# Patient Record
Sex: Female | Born: 1980
Health system: Southern US, Community
[De-identification: ages and names within clinical notes are randomized; demographics above are authoritative.]

## PROBLEM LIST (undated history)

## (undated) DIAGNOSIS — E7800 Pure hypercholesterolemia, unspecified: Secondary | ICD-10-CM

## (undated) DIAGNOSIS — N62 Hypertrophy of breast: Secondary | ICD-10-CM

## (undated) DIAGNOSIS — K219 Gastro-esophageal reflux disease without esophagitis: Secondary | ICD-10-CM

## (undated) DIAGNOSIS — T7840XA Allergy, unspecified, initial encounter: Secondary | ICD-10-CM

## (undated) DIAGNOSIS — M1990 Unspecified osteoarthritis, unspecified site: Secondary | ICD-10-CM

## (undated) DIAGNOSIS — E669 Obesity, unspecified: Secondary | ICD-10-CM

## (undated) HISTORY — PX: BREAST SURGERY: SHX581

## (undated) HISTORY — PX: WISDOM TOOTH EXTRACTION: SHX21

## (undated) HISTORY — PX: REDUCTION MAMMAPLASTY: SUR839

## (undated) HISTORY — DX: Obesity, unspecified: E66.9

## (undated) HISTORY — DX: Unspecified osteoarthritis, unspecified site: M19.90

## (undated) HISTORY — PX: CHOLECYSTECTOMY: SHX55

## (undated) HISTORY — DX: Allergy, unspecified, initial encounter: T78.40XA

## (undated) HISTORY — DX: Hypertrophy of breast: N62

---

## 2002-02-18 ENCOUNTER — Emergency Department (HOSPITAL_COMMUNITY): Admission: EM | Admit: 2002-02-18 | Discharge: 2002-02-18 | Payer: Self-pay | Admitting: Emergency Medicine

## 2004-09-22 ENCOUNTER — Ambulatory Visit: Payer: Self-pay | Admitting: Family Medicine

## 2004-09-22 LAB — CONVERTED CEMR LAB: Pap Smear: NORMAL

## 2005-01-16 ENCOUNTER — Emergency Department (HOSPITAL_COMMUNITY): Admission: EM | Admit: 2005-01-16 | Discharge: 2005-01-16 | Payer: Self-pay | Admitting: Emergency Medicine

## 2005-01-23 ENCOUNTER — Emergency Department (HOSPITAL_COMMUNITY): Admission: EM | Admit: 2005-01-23 | Discharge: 2005-01-23 | Payer: Self-pay | Admitting: Emergency Medicine

## 2005-01-26 ENCOUNTER — Ambulatory Visit: Payer: Self-pay | Admitting: Family Medicine

## 2005-03-10 ENCOUNTER — Ambulatory Visit: Payer: Self-pay | Admitting: Family Medicine

## 2005-05-15 ENCOUNTER — Emergency Department (HOSPITAL_COMMUNITY): Admission: EM | Admit: 2005-05-15 | Discharge: 2005-05-16 | Payer: Self-pay | Admitting: *Deleted

## 2005-08-15 ENCOUNTER — Ambulatory Visit: Payer: Self-pay | Admitting: Family Medicine

## 2005-10-14 ENCOUNTER — Encounter (INDEPENDENT_AMBULATORY_CARE_PROVIDER_SITE_OTHER): Payer: Self-pay | Admitting: Specialist

## 2005-10-14 ENCOUNTER — Ambulatory Visit: Payer: Self-pay | Admitting: Family Medicine

## 2005-10-14 ENCOUNTER — Other Ambulatory Visit: Admission: RE | Admit: 2005-10-14 | Discharge: 2005-10-14 | Payer: Self-pay | Admitting: Family Medicine

## 2005-10-14 LAB — CONVERTED CEMR LAB: Pap Smear: NORMAL

## 2005-10-17 ENCOUNTER — Ambulatory Visit (HOSPITAL_COMMUNITY): Admission: RE | Admit: 2005-10-17 | Discharge: 2005-10-17 | Payer: Self-pay | Admitting: Family Medicine

## 2005-12-14 ENCOUNTER — Emergency Department (HOSPITAL_COMMUNITY): Admission: EM | Admit: 2005-12-14 | Discharge: 2005-12-14 | Payer: Self-pay | Admitting: Emergency Medicine

## 2005-12-19 ENCOUNTER — Emergency Department (HOSPITAL_COMMUNITY): Admission: EM | Admit: 2005-12-19 | Discharge: 2005-12-19 | Payer: Self-pay | Admitting: Emergency Medicine

## 2006-01-25 ENCOUNTER — Ambulatory Visit: Payer: Self-pay | Admitting: Family Medicine

## 2006-01-25 ENCOUNTER — Ambulatory Visit (HOSPITAL_COMMUNITY): Admission: RE | Admit: 2006-01-25 | Discharge: 2006-01-25 | Payer: Self-pay | Admitting: Obstetrics & Gynecology

## 2006-01-25 ENCOUNTER — Encounter (INDEPENDENT_AMBULATORY_CARE_PROVIDER_SITE_OTHER): Payer: Self-pay | Admitting: *Deleted

## 2006-11-24 ENCOUNTER — Encounter: Payer: Self-pay | Admitting: Family Medicine

## 2006-11-24 LAB — CONVERTED CEMR LAB: hCG, Beta Chain, Quant, S: <2 milliintl units/mL

## 2007-02-04 ENCOUNTER — Emergency Department (HOSPITAL_COMMUNITY): Admission: EM | Admit: 2007-02-04 | Discharge: 2007-02-04 | Payer: Self-pay | Admitting: Emergency Medicine

## 2007-04-13 ENCOUNTER — Ambulatory Visit: Payer: Self-pay | Admitting: Family Medicine

## 2007-04-25 ENCOUNTER — Emergency Department (HOSPITAL_COMMUNITY): Admission: EM | Admit: 2007-04-25 | Discharge: 2007-04-25 | Payer: Self-pay | Admitting: Emergency Medicine

## 2007-07-24 ENCOUNTER — Encounter: Payer: Self-pay | Admitting: Family Medicine

## 2007-07-24 DIAGNOSIS — N62 Hypertrophy of breast: Secondary | ICD-10-CM | POA: Insufficient documentation

## 2007-08-12 ENCOUNTER — Emergency Department (HOSPITAL_COMMUNITY): Admission: EM | Admit: 2007-08-12 | Discharge: 2007-08-12 | Payer: Self-pay | Admitting: Emergency Medicine

## 2008-01-02 ENCOUNTER — Ambulatory Visit: Payer: Self-pay | Admitting: Family Medicine

## 2008-01-26 ENCOUNTER — Emergency Department (HOSPITAL_COMMUNITY): Admission: EM | Admit: 2008-01-26 | Discharge: 2008-01-26 | Payer: Self-pay | Admitting: Emergency Medicine

## 2008-03-26 ENCOUNTER — Encounter: Payer: Self-pay | Admitting: Family Medicine

## 2008-03-26 LAB — CONVERTED CEMR LAB
BUN: 9 mg/dL (ref 6–23)
Basophils Absolute: 0 10*3/uL (ref 0.0–0.1)
Basophils Relative: 0 % (ref 0–1)
CO2: 23 meq/L (ref 19–32)
Calcium: 9 mg/dL (ref 8.4–10.5)
Chloride: 105 meq/L (ref 96–112)
Cholesterol: 168 mg/dL (ref 0–200)
Creatinine, Ser: 0.75 mg/dL (ref 0.40–1.20)
Eosinophils Absolute: 0.1 10*3/uL (ref 0.0–0.7)
Eosinophils Relative: 2 % (ref 0–5)
Glucose, Bld: 80 mg/dL (ref 70–99)
HCT: 39.9 % (ref 36.0–46.0)
HDL: 43 mg/dL (ref >39)
Helicobacter Pylori Antibody-IgG: <0.4
Hemoglobin: 12.7 g/dL (ref 12.0–15.0)
LDL Cholesterol: 101 mg/dL — ABNORMAL HIGH (ref 0–99)
Lymphocytes Relative: 39 % (ref 12–46)
Lymphs Abs: 2.8 10*3/uL (ref 0.7–4.0)
MCHC: 31.8 g/dL (ref 30.0–36.0)
MCV: 88.3 fL (ref 78.0–100.0)
Monocytes Absolute: 0.4 10*3/uL (ref 0.1–1.0)
Monocytes Relative: 6 % (ref 3–12)
Neutro Abs: 4 10*3/uL (ref 1.7–7.7)
Neutrophils Relative %: 54 % (ref 43–77)
Platelets: 291 10*3/uL (ref 150–400)
Potassium: 4 meq/L (ref 3.5–5.3)
RBC: 4.52 M/uL (ref 3.87–5.11)
RDW: 13.4 % (ref 11.5–15.5)
Sodium: 137 meq/L (ref 135–145)
Total CHOL/HDL Ratio: 3.9
Triglycerides: 118 mg/dL (ref <150)
VLDL: 24 mg/dL (ref 0–40)
WBC: 7.4 10*3/uL (ref 4.0–10.5)

## 2008-03-31 ENCOUNTER — Ambulatory Visit: Payer: Self-pay | Admitting: Family Medicine

## 2008-03-31 ENCOUNTER — Encounter: Payer: Self-pay | Admitting: Family Medicine

## 2008-03-31 ENCOUNTER — Other Ambulatory Visit: Admission: RE | Admit: 2008-03-31 | Discharge: 2008-03-31 | Payer: Self-pay | Admitting: Family Medicine

## 2008-03-31 DIAGNOSIS — J209 Acute bronchitis, unspecified: Secondary | ICD-10-CM | POA: Insufficient documentation

## 2008-03-31 DIAGNOSIS — R140 Abdominal distension (gaseous): Secondary | ICD-10-CM | POA: Insufficient documentation

## 2008-03-31 DIAGNOSIS — N760 Acute vaginitis: Secondary | ICD-10-CM | POA: Insufficient documentation

## 2008-03-31 LAB — CONVERTED CEMR LAB: Pap Smear: NORMAL

## 2008-04-01 ENCOUNTER — Encounter: Payer: Self-pay | Admitting: Family Medicine

## 2008-04-01 LAB — CONVERTED CEMR LAB
Chlamydia, DNA Probe: NEGATIVE
GC Probe Amp, Genital: NEGATIVE

## 2008-04-02 ENCOUNTER — Ambulatory Visit (HOSPITAL_COMMUNITY): Admission: RE | Admit: 2008-04-02 | Discharge: 2008-04-02 | Payer: Self-pay | Admitting: Family Medicine

## 2008-04-02 ENCOUNTER — Telehealth: Payer: Self-pay | Admitting: Family Medicine

## 2008-04-07 LAB — CONVERTED CEMR LAB
Candida species: POSITIVE — AB
Gardnerella vaginalis: POSITIVE — AB
Trichomonal Vaginitis: NEGATIVE

## 2008-05-14 ENCOUNTER — Encounter: Payer: Self-pay | Admitting: Family Medicine

## 2008-05-27 ENCOUNTER — Encounter: Payer: Self-pay | Admitting: Family Medicine

## 2008-06-30 ENCOUNTER — Telehealth: Payer: Self-pay | Admitting: Family Medicine

## 2008-07-07 ENCOUNTER — Telehealth: Payer: Self-pay | Admitting: Family Medicine

## 2008-07-29 ENCOUNTER — Ambulatory Visit: Payer: Self-pay | Admitting: Family Medicine

## 2008-07-29 DIAGNOSIS — J019 Acute sinusitis, unspecified: Secondary | ICD-10-CM | POA: Insufficient documentation

## 2008-07-30 ENCOUNTER — Telehealth: Payer: Self-pay | Admitting: Family Medicine

## 2008-08-11 ENCOUNTER — Ambulatory Visit: Payer: Self-pay | Admitting: Family Medicine

## 2008-08-11 DIAGNOSIS — N63 Unspecified lump in unspecified breast: Secondary | ICD-10-CM | POA: Insufficient documentation

## 2008-08-14 ENCOUNTER — Ambulatory Visit (HOSPITAL_COMMUNITY): Admission: RE | Admit: 2008-08-14 | Discharge: 2008-08-14 | Payer: Self-pay | Admitting: Family Medicine

## 2008-08-26 ENCOUNTER — Telehealth: Payer: Self-pay | Admitting: Family Medicine

## 2008-08-28 ENCOUNTER — Ambulatory Visit: Payer: Self-pay | Admitting: Family Medicine

## 2008-08-29 ENCOUNTER — Encounter: Payer: Self-pay | Admitting: Family Medicine

## 2008-08-29 LAB — CONVERTED CEMR LAB
Chlamydia, DNA Probe: NEGATIVE
GC Probe Amp, Genital: NEGATIVE

## 2008-08-30 ENCOUNTER — Encounter: Payer: Self-pay | Admitting: Family Medicine

## 2008-09-01 LAB — CONVERTED CEMR LAB
Candida species: NEGATIVE
Gardnerella vaginalis: POSITIVE — AB
Trichomonal Vaginitis: POSITIVE — AB

## 2008-09-14 ENCOUNTER — Emergency Department (HOSPITAL_COMMUNITY): Admission: EM | Admit: 2008-09-14 | Discharge: 2008-09-14 | Payer: Self-pay | Admitting: Emergency Medicine

## 2008-09-16 ENCOUNTER — Emergency Department (HOSPITAL_COMMUNITY): Admission: EM | Admit: 2008-09-16 | Discharge: 2008-09-17 | Payer: Self-pay | Admitting: Emergency Medicine

## 2008-12-10 ENCOUNTER — Encounter: Payer: Self-pay | Admitting: Family Medicine

## 2008-12-23 ENCOUNTER — Encounter: Payer: Self-pay | Admitting: Family Medicine

## 2009-05-08 ENCOUNTER — Telehealth: Payer: Self-pay | Admitting: Family Medicine

## 2009-07-08 ENCOUNTER — Encounter (INDEPENDENT_AMBULATORY_CARE_PROVIDER_SITE_OTHER): Payer: Self-pay | Admitting: *Deleted

## 2009-08-03 ENCOUNTER — Telehealth: Payer: Self-pay | Admitting: Family Medicine

## 2009-08-07 ENCOUNTER — Emergency Department (HOSPITAL_COMMUNITY): Admission: EM | Admit: 2009-08-07 | Discharge: 2009-08-07 | Payer: Self-pay | Admitting: Emergency Medicine

## 2009-11-24 ENCOUNTER — Ambulatory Visit: Payer: Self-pay | Admitting: Family Medicine

## 2009-11-24 ENCOUNTER — Other Ambulatory Visit: Admission: RE | Admit: 2009-11-24 | Discharge: 2009-11-24 | Payer: Self-pay | Admitting: Family Medicine

## 2009-11-24 DIAGNOSIS — M25569 Pain in unspecified knee: Secondary | ICD-10-CM | POA: Insufficient documentation

## 2009-12-03 ENCOUNTER — Telehealth: Payer: Self-pay | Admitting: Family Medicine

## 2010-03-10 ENCOUNTER — Telehealth: Payer: Self-pay | Admitting: Family Medicine

## 2010-04-10 ENCOUNTER — Encounter: Payer: Self-pay | Admitting: Family Medicine

## 2010-04-21 ENCOUNTER — Emergency Department (HOSPITAL_COMMUNITY): Admission: EM | Admit: 2010-04-21 | Discharge: 2010-04-21 | Payer: Self-pay | Admitting: Family Medicine

## 2010-06-08 ENCOUNTER — Ambulatory Visit: Payer: Self-pay | Admitting: Family Medicine

## 2010-06-08 DIAGNOSIS — K648 Other hemorrhoids: Secondary | ICD-10-CM | POA: Insufficient documentation

## 2010-06-08 LAB — CONVERTED CEMR LAB: OCCULT 1: POSITIVE

## 2010-06-11 ENCOUNTER — Other Ambulatory Visit
Admission: RE | Admit: 2010-06-11 | Discharge: 2010-06-11 | Payer: Self-pay | Source: Home / Self Care | Admitting: Family Medicine

## 2010-06-11 ENCOUNTER — Ambulatory Visit: Payer: Self-pay | Admitting: Family Medicine

## 2010-06-11 ENCOUNTER — Encounter: Payer: Self-pay | Admitting: Family Medicine

## 2010-06-12 ENCOUNTER — Encounter: Payer: Self-pay | Admitting: Family Medicine

## 2010-06-12 LAB — CONVERTED CEMR LAB
Chlamydia, DNA Probe: NEGATIVE
GC Probe Amp, Genital: NEGATIVE

## 2010-06-14 LAB — CONVERTED CEMR LAB
Candida species: NEGATIVE
Gardnerella vaginalis: POSITIVE — AB
Retic Ct Pct: 0.9 % (ref 0.4–3.1)

## 2010-06-16 ENCOUNTER — Encounter: Payer: Self-pay | Admitting: Family Medicine

## 2010-06-16 LAB — CONVERTED CEMR LAB: Pap Smear: NEGATIVE

## 2010-07-26 ENCOUNTER — Telehealth: Payer: Self-pay | Admitting: Family Medicine

## 2010-08-03 NOTE — Letter (Signed)
Summary: health screening  health screening   Imported By: Lind Guest 06/11/2010 13:54:13  _____________________________________________________________________  External Attachment:    Type:   Image     Comment:   External Document

## 2010-08-03 NOTE — Progress Notes (Signed)
Summary: MEDICINE  Phone Note Call from Patient   Summary of Call: [facility] ACID REFLUX MEDICINE [facility] INTO [facility] [phone] Initial call taken by: Lind Guest,  August 03, 2009 3:06 PM  Follow-up for Phone Call        Rx Called In Follow-up by: Worthy Keeler LPN,  August 03, 2009 3:48 PM

## 2010-08-03 NOTE — Assessment & Plan Note (Signed)
Summary: phy   Vital Signs:  Patient profile:   30 year old female Height:      65.5 inches Weight:      217.75 pounds BMI:     35.81 O2 Sat:      98 % on Room air Pulse rate:   90 / minute Pulse rhythm:   regular Resp:     16 per minute BP sitting:   110 / 70  (left arm)  Vitals Entered By: Adella Hare LPN (June 11, 2010 9:05 AM)  Nutrition Counseling: Patient's BMI is greater than 25 and therefore counseled on weight management options.  O2 Flow:  Room air CC: physical Is Patient Diabetic? No Pain Assessment Patient in pain? no       Vision Screening:Left eye with correction: 20 / 15 Right eye with correction: 20 / 25 Both eyes with correction: 20 / 13        Vision Entered By: Adella Hare LPN (June 11, 2010 9:06 AM)   CC:  physical.  History of Present Illness: Reports  that  she has been doing well. Denies recent fever or chills. Denies sinus pressure, nasal congestion , ear pain or sore throat. Denies chest congestion, or cough productive of sputum. Denies chest pain, palpitations, PND, orthopnea or leg swelling. Denies abdominal pain, nausea, vomitting, diarrhea or constipation. Denies change in bowel movements , stool is no longer bloody. Denies dysuria , frequency, incontinence or hesitancy. Denies  joint pain, swelling, or reduced mobility. Denies headaches, vertigo, seizures. Denies depression, anxiety or insomnia. Denies  rash, lesions, or itch. Pt is concerned about her weight, andcardiovascular risk , espescially ij light of the fact that she recently lost herMother at age 37to an MI. She is concerned about upper and mid back pain due to gynaecomastia, she has tried in the past to qualify for reduction, and will attempt in the new year, it is warranted     Current Medications (verified): 1)  Nexium 40 Mg Cpdr (Esomeprazole Magnesium) .... Take 1 Capsule By Mouth Once A Day As Needed 2)  Ibuprofen 200 Mg Caps (Ibuprofen) .... Take 1  Tablet By Mouth Twice   A Day As Needed For Pain 3)  Proctofoam 1 % Foam (Pramoxine Hcl) .... Insert Twice Daily Into Anus For 7 Days , Then As Needed For Pain or Bleeding  Allergies (verified): No Known Drug Allergies  Review of Systems      See HPI Eyes:  Complains of vision loss-both eyes; corrective lenses since childhood. GI:  Complains of hemorrhoids. GU:  Complains of discharge; wants STD testing. Endo:  Denies cold intolerance, excessive hunger, excessive thirst, and excessive urination. Heme:  Denies abnormal bruising and bleeding. Allergy:  Denies hives or rash and itching eyes.  Physical Exam  General:  Well-developed,obese,in no acute distress; alert,appropriate and cooperative throughout examination Head:  Normocephalic and atraumatic without obvious abnormalities. No apparent alopecia or balding. Eyes:  No corneal or conjunctival inflammation noted. EOMI. Perrla. Funduscopic exam benign, without hemorrhages, exudates or papilledema. Vision grossly normal. Ears:  External ear exam shows no significant lesions or deformities.  Otoscopic examination reveals clear canals, tympanic membranes are intact bilaterally without bulging, retraction, inflammation or discharge. Hearing is grossly normal bilaterally. Nose:  External nasal examination shows no deformity or inflammation. Nasal mucosa are pink and moist without lesions or exudates. Mouth:  Oral mucosa and oropharynx without lesions or exudates.  Teeth in good repair. Neck:  No deformities, masses, or tenderness noted.  Chest Wall:  No deformities, masses, or tenderness noted. Breasts:  No mass, nodules, thickening, tenderness, bulging, retraction, inflamation, nipple discharge or skin changes noted.  macromastia Lungs:  Normal respiratory effort, chest expands symmetrically. Lungs are clear to auscultation, no crackles or wheezes. Heart:  Normal rate and regular rhythm. S1 and S2 normal without gallop, murmur, click, rub or  other extra sounds. Abdomen:  Bowel sounds positive,abdomen soft and non-tender without masses, organomegaly or hernias noted. Genitalia:  Normal introitus for age, no external lesions, malodoros vaginal discharge, mucosa pink and moist, no vaginal or cervical lesions, no vaginal atrophy, no friaility or hemorrhage, normal uterus size and position, no adnexal masses or tenderness Msk:  No deformity or scoliosis noted of thoracic or lumbar spine.   Pulses:  R and L carotid,radial,femoral,dorsalis pedis and posterior tibial pulses are full and equal bilaterally Extremities:  No clubbing, cyanosis, edema, or deformity noted with normal full range of motion of all joints.   Neurologic:  No cranial nerve deficits noted. Station and gait are normal. Plantar reflexes are down-going bilaterally. DTRs are symmetrical throughout. Sensory, motor and coordinative functions appear intact. Skin:  Intact without suspicious lesions or rashes Cervical Nodes:  No lymphadenopathy noted Axillary Nodes:  No palpable lymphadenopathy Inguinal Nodes:  No significant adenopathy Psych:  Cognition and judgment appear intact. Alert and cooperative with normal attention span and concentration. No apparent delusions, illusions, hallucinations   Impression & Recommendations:  Problem # 1:  PHYSICAL EXAMINATION (ICD-V70.0) Assessment Comment Only pap sent. Safe sex discussed. seatbelt use stressed. Change in diet and need for regular physical activity discussed.   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 Pt counselled re need for lifestylechange to reduce her risk including reducing lipid intake and increasing physical activity to improve her hDL. She is motivated  Complete Medication List: 1)  Nexium 40 Mg Cpdr (Esomeprazole magnesium) .... Take 1 capsule by mouth once a day as needed 2)  Ibuprofen 200 Mg Caps (Ibuprofen) .... Take 1 tablet by mouth twice   a day as needed for pain 3)  Proctofoam 1 % Foam (Pramoxine hcl) .... Insert twice daily into anus for 7 days , then as needed for pain or bleeding  Other Orders: T- Hemoglobin A1C (09811-91478) T-Anemia Panel 3  (2904) T-Lipid Profile (29562-13086) Pap Smear (57846)  Patient Instructions: 1)  Please schedule a follow-up appointment in 4 months. 2)  It is important that you exercise regularly at least320 minutes 6 times a week. If you develop chest pain, have severe difficulty breathing, or feel very tired , stop exercising immediately and seek medical attention. 3)  You need to lose weight. Consider a lower calorie diet and regular exercise. Goal is 8 to 10 pounds. 4)  HBA1C and anemia panel  today.Marland Kitchen 5)  Ditary as discuused  6)  fasting lipid in 4 months  Orders Added: 1)  Est. Patient 18-39 years [99395] 2)  T- Hemoglobin A1C [83036-23375] 3)  T-Anemia Panel 3  [2904] 4)  T-Lipid Profile [80061-22930] 5)  Pap Smear [88150] 6)  T-Wet Prep by Molecular Probe [[id number]] 7)  T-Chlamydia & GC Probe, Genital [87491/[id number]] 8)  EKG w/ Interpretation [93000]

## 2010-08-03 NOTE — Letter (Signed)
Summary: 1st Missed Appt.  [facility]  [address]   [place], [place] [address]   Phone: [phone]  Fax: [phone]    July 08, 2009  MRN: [id number]  Shawna Gonzales [address] [place], [place]  [address]  Dear Ms. [name],  At [facility], we make every attempt to fit patients into our schedule by reserving several appointment slots for same-day appointments.  However, we cannot always make appointments for patients the same day they are calling.  At the end of the day, we look back at our schedule and find that because of last-minute cancellations and patients not showing up for their scheduled appointments, we have several appointment slots that are left open and could have been used by another person who really needed it.  In the past, you may have been one of the patients who could not get in when you needed to.  But recently, you were one of the patients with an appointment that you didn't show up for or canceled too late for [place] to fill it.  We choose not to charge no-show or last minute cancellation fees to our patients, like many other offices do.  We do not wish to institute that policy and hope we never have to.  However, we kindly request that you assist [place] by providing at least 24 hours' notice if you can't make your appointment.  If no-shows or late cancellations become habitual (i.e. Three or more in a one-year period), we may terminate the physician-patient relationship.    Thank you for your consideration and cooperation.   Altamease Oiler

## 2010-08-03 NOTE — Assessment & Plan Note (Signed)
Summary: physical- room 2   Vital Signs:  Patient profile:   30 year old female Height:      65.5 inches Weight:      215 pounds BMI:     35.36 O2 Sat:      99 % on Room air Pulse rate:   97 / minute Resp:     16 per minute BP sitting:   122 / 70  (left arm)  Vitals Entered By: Adella Hare LPN (Nov 24, 2009 1:28 PM)  Nutrition Counseling: Patient's BMI is greater than 25 and therefore counseled on weight management options. CC: physical Is Patient Diabetic? No Pain Assessment Patient in pain? no        CC:  physical.  History of Present Illness: Pt is here today for her physical.  Has sinus congestion and productive cough.  This started about 1 1/2 weeks ago and has worsened.  Greenish phlegm.  Throat is sore.  She is not taking any over the counter cold meds.  Menses are regular. No current birth control.  Pt states she is not currently sexually active & declines prescription today.  No vag dischg.   +periodic SBE's.  Interested in breast reduction surgery but doesn't have ins currently.  Has had nl breast US in past.  Mom passes away 10-05-09.  Feels like she is emotionally doing OK with this.  She is living with her father & trying to help him.    Current Medications (verified): 1)  Mirena 20 Mcg/24hr Iud (Levonorgestrel) .... Good For 5 Years  Allergies (verified): No Known Drug Allergies  Past History:  Past medical, surgical, family and social histories (including risk factors) reviewed, and no changes noted (except as noted below).  Past Medical History: Reviewed history from 07/24/2007 and no changes required. macromastia obesity contraceptive management  Family History: Reviewed history from 07/24/2007 and no changes required. Mother deceased 73 yo, MI Father  living hyperlipidemia 2 sisters- living  healthy MGF- MI, deceased at 29yo PGM- HTN  Social History: Reviewed history from 07/24/2007 and no changes required. Single Never  Smoked Alcohol use-no Drug use-no student  Review of Systems General:  Denies chills and fever. ENT:  Complains of nasal congestion, sinus pressure, and sore throat; denies earache and postnasal drainage. CV:  Denies chest pain or discomfort. Resp:  Complains of cough and sputum productive; denies wheezing. GI:  Denies bloody stools, change in bowel habits, constipation, dark tarry stools, diarrhea, indigestion, nausea, and vomiting. GU:  Denies abnormal vaginal bleeding, discharge, dysuria, incontinence, and urinary frequency. MS:  Complains of joint pain; denies low back pain and mid back pain; RT KNEE PAIN STIFFNESS X YRS. Derm:  Denies lesion(s) and rash. Neuro:  Denies numbness and tingling. Psych:  Denies anxiety and depression.  Physical Exam  General:  Well-developed,well-nourished,in no acute distress; alert,appropriate and cooperative throughout examination Head:  Normocephalic and atraumatic without obvious abnormalities. No apparent alopecia or balding. Ears:  External ear exam shows no significant lesions or deformities.  Otoscopic examination reveals clear canals, tympanic membranes are intact bilaterally without bulging, retraction, inflammation or discharge. Hearing is grossly normal bilaterally. Nose:  no external deformity.  Nasal turbs are severely swollen bilat.  No sinus tenderness to perc. Mouth:  Oral mucosa and oropharynx without lesions or exudates.  Teeth in good repair. Neck:  No deformities, masses, or tenderness noted.no thyromegaly.   Chest Wall:  no deformities and no mass.   Breasts:  No mass, nodules, thickening, tenderness, bulging,  retraction, inflamation, nipple discharge or skin changes noted.   Lungs:  Normal respiratory effort, chest expands symmetrically. Lungs are clear to auscultation, no crackles or wheezes. Heart:  Normal rate and regular rhythm. S1 and S2 normal without gallop, murmur, click, rub or other extra sounds. Abdomen:  Bowel sounds  positive,abdomen soft and non-tender without masses, organomegaly or hernias noted. Genitalia:  Normal introitus for age, no external lesions, no vaginal discharge, mucosa pink and moist, no vaginal or cervical lesions, no vaginal atrophy, no friaility or hemorrhage, normal uterus size and position, no adnexal masses or tenderness Msk:  Rt knee: normal ROM, no joint tenderness, no joint swelling, no joint warmth, no joint instability, and no crepitation.   Extremities:  No clubbing, cyanosis, edema, or deformity noted with normal full range of motion of all joints.   Neurologic:  alert & oriented X3, gait normal, and DTRs symmetrical and normal.   Skin:  Intact without suspicious lesions or rashes Cervical Nodes:  R posterior LN enlarged and L posterior LN enlarged.   Axillary Nodes:  No palpable lymphadenopathy Psych:  Cognition and judgment appear intact. Alert and cooperative with normal attention span and concentration. No apparent delusions, illusions, hallucinations   Impression & Recommendations:  Problem # 1:  WELL WOMAN (ICD-V70.0) Assessment Comment Only Encouraged SBE's. Pt to check with employer or health dept for Tdap.  Problem # 2:  SINUSITIS, ACUTE (ICD-461.9) Assessment: New  The following medications were removed from the medication list:    Flagyl 500 Mg Tabs (Metronidazole) ..... One tab by mouth bid Her updated medication list for this problem includes:    Amoxicillin 500 Mg Caps (Amoxicillin) .Marland Kitchen... Take 2 two times a day x 10 days  Problem # 3:  FAMILY HISTORY OF SUDDEN CARDIAC DEATH (ICD-V17.41) Assessment: Comment Only Discussed importance of prevention.  Will need to monitor blood pressure and cholesterol levels. Consider cardiac consult at approx 30 yo. Last lipids 2009 with nl levels. Pt does not have ins so will wait 1 yr to check again.  Problem # 4:  KNEE PAIN, RIGHT, CHRONIC (ICD-719.46) Assessment: Unchanged Pt declined ortho consult at this  time.  Problem # 5:  OBESITY, UNSPECIFIED (ICD-278.00) Assessment: Comment Only Discussed diet and exercise.  Discussed as [place] employee services avail including [employer] and exercise classes.  Ht: 65.5 (11/24/2009)   Wt: 215 (11/24/2009)   BMI: 35.36 (11/24/2009)  Complete Medication List: 1)  Amoxicillin 500 Mg Caps (Amoxicillin) .... Take 2 two times a day x 10 days  Patient Instructions: 1)  Please schedule a follow-up appointment in 1 year for your yearly pap and pelvic exam. 2)  I have prescribed an antibiotic for you. 3)  I recommend you use an over the counter antihistamine & decongestant.  You can also use Afrin nasal spray as discussed for 3 days. 4)  I recommend you get a Tetnus vaccine (Tdap). Please check at the health dept regarding this. Prescriptions: AMOXICILLIN 500 MG CAPS (AMOXICILLIN) take 2 two times a day x 10 days  #40 x 0   Entered and Authorized by:   Esperanza Sheets PA   Signed by:   Esperanza Sheets PA on 11/24/2009   Method used:   Electronically to        [facility]  [address] (retail)       [address]       [place], [place]  [address]  Ph: [phone]       Fax: [phone]   RxID:   [id number]

## 2010-08-03 NOTE — Progress Notes (Signed)
Summary: speak with nurse  Phone Note Call from Patient   Summary of Call: pt needs to speak with nurse about meds. [phone] Initial call taken by: Rudene Anda,  March 10, 2010 11:19 AM    New/Updated Medications: OMEPRAZOLE 40 MG CPDR (OMEPRAZOLE) one cap by mouth once daily Prescriptions: OMEPRAZOLE 40 MG CPDR (OMEPRAZOLE) one cap by mouth once daily  #30 x 5   Entered by:   Adella Hare LPN   Authorized by:   Syliva Overman MD   Signed by:   Adella Hare LPN on 29/56/2130   Method used:   Electronically to        [facility] [address] (retail)       [phone]       [place], [place]  [address]       Ph: [phone]       Fax: [phone]   RxID:   [id number]

## 2010-08-03 NOTE — Progress Notes (Signed)
Summary: results for pap  Phone Note Call from Patient   Summary of Call: would like to get pap results.  [phone] Initial call taken by: Rudene Anda,  December 03, 2009 10:39 AM  Follow-up for Phone Call        spoke with cytology and they are gonna get [place] reports we dont have but states all reports are negative Follow-up by: Adella Hare LPN,  December 04, 979 3:28 PM  Additional Follow-up for Phone Call Additional follow up Details #1::        patient aware Additional Follow-up by: Adella Hare LPN,  December 03, 1912 3:28 PM

## 2010-08-05 NOTE — Assessment & Plan Note (Signed)
Summary: blood in stool   Vital Signs:  Patient profile:   30 year old female Height:      65.5 inches Weight:      220.75 pounds BMI:     36.31 O2 Sat:      98 % on Room air Pulse rate:   86 / minute Pulse rhythm:   regular Resp:     16 per minute BP sitting:   100 / 70  (left arm)  Vitals Entered By: Adella Hare LPN (June 08, 2010 9:25 AM)  Nutrition Counseling: Patient's BMI is greater than 25 and therefore counseled on weight management options.  O2 Flow:  Room air CC: saw some blood in stool yesterday Is Patient Diabetic? No Pain Assessment Patient in pain? no        CC:  saw some blood in stool yesterday.  History of Present Illness: 2 day h/o bRRB , , 2 bloody and 2 no visiblre blood. Initially there was pain with the bleed, first episode. No fam h/o colon disease, no personal h/o altered bM.  Has felt swimmy headed recently, recently bP was high 163/93  report excessive bloating and belcing off omeprazole x2 years, really worse in the past day.   Current Medications (verified): 1)  Omeprazole 40 Mg Cpdr (Omeprazole) .... One Cap By Mouth Once Daily 2)  Ibuprofen 200 Mg Tabs (Ibuprofen) .... As Needed For Pain  Allergies (verified): No Known Drug Allergies  Past History:  Social history (including risk factors) reviewed for relevance to current acute and chronic problems.  Social History: Reviewed history from 07/24/2007 and no changes required. Single Never Smoked Alcohol use-no Drug use-no physical therapist, works at [employer]      See HPI Eyes:  Denies discharge and red eye. GI:  Complains of abdominal pain, bloody stools, and gas. MS:  Complains of joint pain, mid back pain, and stiffness; mid back pain with gyneconmastia  and knee pain and stifness associated with reduced . Endo:  Denies cold intolerance and excessive hunger. Heme:  Denies abnormal bruising and bleeding. Allergy:  Denies hives or rash and itching  eyes.  Physical Exam  General:  Well-developed,well-nourished,in no acute distress; alert,appropriate and cooperative throughout examination HEENT: No facial asymmetry,  EOMI, No sinus tenderness, TM's Clear, oropharynx  pink and moist.   Chest: Clear to auscultation bilaterally.  CVS: S1, S2, No murmurs, No S3.   Abd: Soft, Nontender.  MS: Adequate ROM spine, hips, shoulders and knees.  Ext: No edema.   CNS: CN 2-12 intact, power tone and sensation normal throughout.   Skin: Intact, no visible lesions or rashes.  Psych: Good eye contact, normal affect.  Memory intact, not anxious or depressed appearing. 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 As needed for pain Her updated medication list for this problem includes:    Ibuprofen 200 Mg Caps (Ibuprofen) .Marland Kitchen... Take 1 tablet by mouth twice   a day as needed for pain  Complete Medication List: 1)  Nexium 40 Mg Cpdr (Esomeprazole magnesium) .... Take 1 capsule by mouth once a day as needed 2)  Ibuprofen 200 Mg Caps (Ibuprofen) .... Take 1 tablet by mouth twice   a day as  needed for pain 3)  Proctofoam 1 % Foam (Pramoxine hcl) .... Insert twice daily into anus for 7 days , then as needed for pain or bleeding  Other Orders: Hemoccult Guaiac-1 spec.(in office) ([address]657)  Patient Instructions: 1)  cPE as before. 2)  PLs remeber to bring labs. 3)  It is important that you exercise regularly at least 20 minutes 5 times a week. If you develop chest pain, have severe difficulty breathing, or feel very tired , stop exercising immediately and seek medical attention. 4)  You need to lose weight. Consider a lower calorie diet and regular exercise.  5)   pls follow a low fat diet. 6)  Bleeding appears to be from an internal hemmorhoid, which is a swollen vein. 7)  keep stool soft, daily softener is fine, alot of watery foods, fruit and veg, also fuber, eg bran. 8)  med is sentin, call ifprob persits or worsens  Prescriptions: PROCTOFOAM 1 % FOAM (PRAMOXINE HCL) insert twice daily into anus for 7 days , then as needed for pain or bleeding  #45 gm x 0   Entered and Authorized by:   Syliva Overman MD   Signed by:   Syliva Overman MD on 06/08/2010   Method used:   Electronically to        [facility] (retail)       [address]       [address]       [place], [place]  [address]       Ph: [phone]       Fax: [phone]   RxID:   [id number] IBUPROFEN 200 MG CAPS (IBUPROFEN) Take 1 tablet by mouth twice   a day as needed for pain  #90 x 0   Entered and Authorized by:   Syliva Overman MD   Signed by:   Syliva Overman MD on 06/08/2010   Method used:   Electronically to        [facility]* (retail)       [address]       [address]       [place], [place]  [address]       Ph: [phone]       Fax: [phone]   RxID:   [id number] NEXIUM 40 MG CPDR (ESOMEPRAZOLE MAGNESIUM) Take 1 capsule by mouth once a day as needed  #90 x 0   Entered and Authorized by:   Syliva Overman MD   Signed by:   Syliva Overman MD on 06/08/2010   Method used:   Electronically to        [facility]* (retail)       [address]       [address]       [place], [place]  [address]       Ph: [phone]       Fax: [phone]   RxID:   [id number]    Orders Added: 1)  Est. Patient Level III [82993] 2)  Hemoccult Guaiac-1 spec.(in office) [82270]    Laboratory Results  Date/Time Received: June 08, 2010 10:23 AM  Date/Time Reported: June 08, 2010 10:23 AM   Stool - Occult Blood Hemmoccult #1:  positive Date: 06/08/2010 Comments: 5030 05/14 50590 1L 03/12 Adella Hare LPN  June 08, 2010 10:24 AM

## 2010-08-05 NOTE — Progress Notes (Signed)
Summary: needs medication notes  Phone Note Call from Patient   Summary of Call: needs to talk with jamie. she is having a breast reduction and needs somethings from nurse. [phone] Initial call taken by: Rudene Anda,  July 26, 2010 2:07 PM  Follow-up for Phone Call        wants her office visits where she was prescribed any pain meds mailed to her, is this okay Follow-up by: Adella Hare LPN,  July 26, 2010 5:10 PM  Additional Follow-up for Phone Call Additional follow up Details #1::        have her send a signed release form for records to be sent to the treating doctor or her insurance company as she needs the info for he breast reductin since her breasts cause back pain , pls Additional Follow-up by: Syliva Overman MD,  July 27, 2010 12:07 AM    Additional Follow-up for Phone Call Additional follow up Details #2::    returned call, left message Follow-up by: Adella Hare LPN,  July 27, 2010 2:16 PM  Additional Follow-up for Phone Call Additional follow up Details #3:: Details for Additional Follow-up Action Taken: patient aware Additional Follow-up by: Adella Hare LPN,  July 27, 2010 4:48 PM

## 2010-08-05 NOTE — Letter (Signed)
Summary: Pap Smear, Normal Letter, [facility]  [address]   [place], [place] [address]   Phone: [phone]  Fax: [phone]          June 16, 2010    Dear: Shawna Gonzales    I am pleased to notify you that your PAP smear was normal.  You will need your next PAP smear in:     ____ 3 Months    ____ 6 Months    ____ 12 Months    Please call the office at our office number above, to schedule your next appointment.    Sincerely,     [facility]

## 2010-08-09 ENCOUNTER — Encounter: Payer: Self-pay | Admitting: Family Medicine

## 2010-08-19 NOTE — Letter (Signed)
Summary: medical release  medical release   Imported By: Lind Guest 08/09/2010 14:07:56  _____________________________________________________________________  External Attachment:    Type:   Image     Comment:   External Document

## 2010-09-25 ENCOUNTER — Emergency Department (HOSPITAL_COMMUNITY)
Admission: EM | Admit: 2010-09-25 | Discharge: 2010-09-25 | Disposition: A | Discharge status: Home / Self Care | Payer: 59 | Attending: Emergency Medicine | Admitting: Emergency Medicine

## 2010-09-25 DIAGNOSIS — R071 Chest pain on breathing: Secondary | ICD-10-CM | POA: Insufficient documentation

## 2010-09-25 DIAGNOSIS — Z8249 Family history of ischemic heart disease and other diseases of the circulatory system: Secondary | ICD-10-CM | POA: Insufficient documentation

## 2010-10-05 ENCOUNTER — Encounter: Payer: Self-pay | Admitting: Family Medicine

## 2010-10-07 ENCOUNTER — Encounter: Payer: Self-pay | Admitting: Family Medicine

## 2010-10-11 ENCOUNTER — Encounter: Payer: Self-pay | Admitting: Family Medicine

## 2010-10-11 ENCOUNTER — Ambulatory Visit: Payer: 59 | Admitting: Family Medicine

## 2010-10-14 LAB — URINE MICROSCOPIC-ADD ON

## 2010-10-14 LAB — URINALYSIS, ROUTINE W REFLEX MICROSCOPIC
Bilirubin Urine: NEGATIVE
Bilirubin Urine: NEGATIVE
Glucose, UA: NEGATIVE mg/dL
Glucose, UA: NEGATIVE mg/dL
Ketones, ur: 15 mg/dL — AB
Nitrite: NEGATIVE
Nitrite: NEGATIVE
Protein, ur: 30 mg/dL — AB
Protein, ur: 30 mg/dL — AB
Specific Gravity, Urine: 1.025 (ref 1.005–1.030)
Specific Gravity, Urine: 1.015 (ref 1.005–1.030)
Urobilinogen, UA: 0.2 mg/dL (ref 0.0–1.0)
Urobilinogen, UA: 0.2 mg/dL (ref 0.0–1.0)
pH: 7 (ref 5.0–8.0)
pH: 8.5 — ABNORMAL HIGH (ref 5.0–8.0)

## 2010-10-14 LAB — DIFFERENTIAL
Basophils Absolute: 0.1 10*3/uL (ref 0.0–0.1)
Basophils Relative: 1 % (ref 0–1)
Eosinophils Absolute: 0.2 10*3/uL (ref 0.0–0.7)
Eosinophils Relative: 2 % (ref 0–5)
Lymphocytes Relative: 23 % (ref 12–46)
Lymphs Abs: 2.5 10*3/uL (ref 0.7–4.0)
Monocytes Absolute: 0.5 10*3/uL (ref 0.1–1.0)
Monocytes Relative: 4 % (ref 3–12)
Neutro Abs: 7.7 10*3/uL (ref 1.7–7.7)
Neutrophils Relative %: 70 % (ref 43–77)

## 2010-10-14 LAB — CBC
HCT: 37 % (ref 36.0–46.0)
Hemoglobin: 12.3 g/dL (ref 12.0–15.0)
MCHC: 33.2 g/dL (ref 30.0–36.0)
MCV: 78.8 fL (ref 78.0–100.0)
Platelets: 299 10*3/uL (ref 150–400)
RBC: 4.7 MIL/uL (ref 3.87–5.11)
RDW: 15.4 % (ref 11.5–15.5)
WBC: 10.9 10*3/uL — ABNORMAL HIGH (ref 4.0–10.5)

## 2010-10-14 LAB — WET PREP, GENITAL
Trich, Wet Prep: NONE SEEN
Yeast Wet Prep HPF POC: NONE SEEN

## 2010-10-14 LAB — PREGNANCY, URINE: Preg Test, Ur: NEGATIVE

## 2010-11-19 NOTE — Op Note (Signed)
NAMEMARGRETE, Shawna Gonzales           ACCOUNT NO.:  [id number]   MEDICAL RECORD NO.:  [id number]          PATIENT TYPE:  AMB   LOCATION:  DAY                           FACILITY:  [facility]   PHYSICIAN:  Lazaro Arms, M.D.   DATE OF BIRTH:  26-May-1981   DATE OF PROCEDURE:  01/25/2006  DATE OF DISCHARGE:                                 OPERATIVE REPORT   PREOPERATIVE DIAGNOSIS:  Molar pregnancy.   POSTOPERATIVE DIAGNOSIS:  Molar pregnancy.   PROCEDURE:  Suction and sharp uterine curettage.   SURGEON:  Dr. Despina Hidden.   ANESTHESIA:  Laryngeal mask airway.   FINDINGS:  Patient came in today, referred with quantitative HCG over  127,000, and the ultrasound revealed a very complex endometrium with lots of  little cystic grape-like areas on ultrasound, consistent with a molar  pregnancy.  She did have a small cyst on one ovary, but there was no  evidence of any ectopic.  As a result, we proceeded with a D&C.   DESCRIPTION OF OPERATION:  The patient was taken to the operating room and  placed in supine position, underwent laryngeal mask airway, placed in dorsal  lithotomy position, prepped and draped in usual sterile fashion.  A Graves  speculum was placed.  The cervix was grasped with a single toothed  tenaculum.  It had already been dilated from previously bleeding.  A 10-  French curved suction curette was used and several passes were made, and a  moderate amount of tissue returned.  The bleeding was certainly not  excessive.  It was actually very appropriate.  She had been typed and  crossed, but I canceled that when I saw that her bleeding was going to be  minimal.  Good uterine cry was obtained in all areas with a sharp curettage.  One more pass was made with the suction and she received Methergine 0.2 mg  IM.  She was awakened from anesthesia, taken to recovery room in good stable  condition.  She received Ancef and Toradol prophylactically.      Lazaro Arms, M.D.  Electronically  Signed     LHE/MEDQ  D:  01/25/2006  T:  01/25/2006  Job:  [id number]

## 2010-12-21 ENCOUNTER — Encounter: Payer: Self-pay | Admitting: Family Medicine

## 2010-12-22 ENCOUNTER — Encounter: Payer: Self-pay | Admitting: Family Medicine

## 2010-12-22 ENCOUNTER — Ambulatory Visit (INDEPENDENT_AMBULATORY_CARE_PROVIDER_SITE_OTHER): Payer: 59 | Admitting: Family Medicine

## 2010-12-22 VITALS — BP 124/82 | HR 87 | Resp 16 | Ht 65.0 in | Wt 235.1 lb

## 2010-12-22 DIAGNOSIS — E669 Obesity, unspecified: Secondary | ICD-10-CM

## 2010-12-22 DIAGNOSIS — N62 Hypertrophy of breast: Secondary | ICD-10-CM

## 2010-12-22 DIAGNOSIS — K3189 Other diseases of stomach and duodenum: Secondary | ICD-10-CM

## 2010-12-22 DIAGNOSIS — Z23 Encounter for immunization: Secondary | ICD-10-CM

## 2010-12-22 DIAGNOSIS — Z13220 Encounter for screening for lipoid disorders: Secondary | ICD-10-CM

## 2010-12-22 DIAGNOSIS — R1013 Epigastric pain: Secondary | ICD-10-CM

## 2010-12-22 MED ORDER — ORLISTAT 120 MG PO CAPS: 120.0000 mg | ORAL_CAPSULE | Freq: Three times a day (TID) | ORAL | Status: DC

## 2010-12-22 NOTE — Patient Instructions (Addendum)
F/U in 3 months.   After you call we will refer you for gallbladder studies  It is important that you exercise regularly at least 30 minutes 5 times a week. If you develop chest pain, have severe difficulty breathing, or feel very tired, stop exercising immediately and seek medical attention   A healthy diet is rich in fruit, vegetables and whole grains. Poultry fish, nuts and beans are a healthy choice for protein rather then red meat. A low sodium diet and drinking 64 ounces of water daily is generally recommended. Oils and sweet should be limited. Carbohydrates especially for those who are diabetic or overweight, should be limited to 34-45 gram per meal. It is important to eat on a regular schedule, at least 3 times daily. Snacks should be primarily fruits, vegetables or nuts.   Call the [facility], the teaching program [address], /[place], ask about Shawna Gonzales getting an appt for weight loss help   Fasting lipid, hepatic, blood sugar , tsh , hBA1c asap   Start xenical, and you will get a 1600 cal diet sheet  TDAP today  Weight loss goal of 10 pounds

## 2010-12-22 NOTE — Assessment & Plan Note (Signed)
Deteriorated. Patient re-educated about  the importance of commitment to a  minimum of 150 minutes of exercise per week. The importance of healthy food choices with portion control discussed. Encouraged to start a food diary, count calories and to consider  joining a support group. Sample diet sheets offered. Goals set by the patient for the next several months.    

## 2010-12-22 NOTE — Progress Notes (Signed)
  Subjective:    Patient ID: Shawna Gonzales, female    DOB: 1980/07/24, 30 y.o.   MRN: [id number]  HPI Pt notes in the past several weeks she has noted bloating and excessive belching.  Wants fasting lipids, very concerned since her mother died of a massive heart attack under the age of 34, previously had been well , to her knowledge  Weight is an issue, she is extremely concerned  About safe weight loss, with the recent death of her Mom   Review of Systems Denies recent fever or chills. Denies sinus pressure, nasal congestion, ear pain or sore throat. Denies chest congestion, productive cough or wheezing. Denies chest pains, palpitations, paroxysmal nocturnal dyspnea, orthopnea and leg swelling Denies abdominal pain, nausea, vomiting,diarrhea or constipation.  Denies rectal bleeding or change in bowel movement. Denies dysuria, frequency, hesitancy or incontinence. Denies joint pain, swelling and limitation in mobility. Denies headaches, seizure, numbness, or tingling. Denies depression, anxiety or insomnia. Denies skin break down or rash.        Objective:   Physical Exam Patient alert and oriented and in no Cardiopulmonary distress.  HEENT: No facial asymmetry, EOMI, no sinus tenderness, TM's clear, Oropharynx pink and moist.  Neck supple no adenopathy.  Chest: Clear to auscultation bilaterally.  CVS: S1, S2 no murmurs, no S3.  ABD: Soft non tender. Bowel sounds normal.  Ext: No edema  MS: Adequate ROM spine, shoulders, hips and knees.  Skin: Intact, no ulcerations or rash noted.  Psych: Good eye contact, normal affect. Memory intact not anxious or depressed appearing.  CNS: CN 2-12 intact, power, tone and sensation normal throughout.        Assessment & Plan:

## 2010-12-23 ENCOUNTER — Telehealth: Payer: Self-pay | Admitting: Family Medicine

## 2010-12-24 NOTE — Telephone Encounter (Signed)
msg left that attempt was made to respond.  Pls let her know if she calls back there is no other prescription med for weight loss than the phentermine, that she does not want to take and should not becauase of her family history.

## 2010-12-27 LAB — HEPATIC FUNCTION PANEL
AST: 25 U/L (ref 0–37)
Albumin: 3.8 g/dL (ref 3.5–5.2)
Alkaline Phosphatase: 90 U/L (ref 39–117)
Total Bilirubin: 0.4 mg/dL (ref 0.3–1.2)

## 2010-12-27 LAB — HEMOGLOBIN A1C: Mean Plasma Glucose: 117 mg/dL — ABNORMAL HIGH (ref <117)

## 2010-12-27 LAB — LIPID PANEL: HDL: 38 mg/dL — ABNORMAL LOW (ref >39)

## 2010-12-27 NOTE — Telephone Encounter (Signed)
Patient aware.

## 2010-12-29 ENCOUNTER — Telehealth: Payer: Self-pay | Admitting: Family Medicine

## 2010-12-29 NOTE — Telephone Encounter (Signed)
Wants a lipid profile? Said its different from the lipid panel? I told her I would send the msg to you because I didn't know anything about that

## 2011-01-04 NOTE — Telephone Encounter (Signed)
pls let pt know the labs are ordered, they are on a prescription which I will leave with you in an envelope, she needs to fast

## 2011-01-05 NOTE — Assessment & Plan Note (Signed)
Recently had a flare up, currently asymptomatic, will call back when ready for gallbladder eval

## 2011-01-05 NOTE — Assessment & Plan Note (Signed)
Unchanged, no success with attempts at reduction

## 2011-01-06 NOTE — Telephone Encounter (Signed)
CALLED PATIENT, LEFT MESSAGE.  

## 2011-01-07 NOTE — Telephone Encounter (Signed)
Patient aware.

## 2011-01-18 ENCOUNTER — Telehealth: Payer: Self-pay | Admitting: Family Medicine

## 2011-01-18 ENCOUNTER — Other Ambulatory Visit: Payer: Self-pay | Admitting: Family Medicine

## 2011-01-18 DIAGNOSIS — E785 Hyperlipidemia, unspecified: Secondary | ICD-10-CM

## 2011-01-18 DIAGNOSIS — E669 Obesity, unspecified: Secondary | ICD-10-CM

## 2011-01-18 DIAGNOSIS — R7301 Impaired fasting glucose: Secondary | ICD-10-CM

## 2011-01-18 NOTE — Telephone Encounter (Signed)
Inquiring about a request from earlier this year

## 2011-01-20 LAB — NMR LIPOPROFILE WITH LIPIDS
HDL Particle Number: 38.5 umol/L (ref >=30.5)
HDL-C: 45 mg/dL (ref >=40)
LDL (calc): 100 mg/dL — ABNORMAL HIGH (ref <100)

## 2011-01-25 ENCOUNTER — Telehealth: Payer: Self-pay | Admitting: Family Medicine

## 2011-01-25 MED ORDER — ROSUVASTATIN CALCIUM 5 MG PO TABS: 5.0000 mg | ORAL_TABLET | Freq: Every day | ORAL | Status: DC

## 2011-01-25 NOTE — Progress Notes (Signed)
Addended by: Abner Greenspan on: 01/25/2011 09:44 AM   Modules accepted: Orders

## 2011-01-25 NOTE — Telephone Encounter (Signed)
Patient aware of lab results.

## 2011-02-13 ENCOUNTER — Inpatient Hospital Stay (INDEPENDENT_AMBULATORY_CARE_PROVIDER_SITE_OTHER)
Admission: RE | Admit: 2011-02-13 | Discharge: 2011-02-13 | Disposition: A | Discharge status: Home / Self Care | Payer: 59 | Source: Ambulatory Visit | Attending: Family Medicine | Admitting: Family Medicine

## 2011-02-13 ENCOUNTER — Ambulatory Visit (INDEPENDENT_AMBULATORY_CARE_PROVIDER_SITE_OTHER): Payer: 59

## 2011-02-13 DIAGNOSIS — J40 Bronchitis, not specified as acute or chronic: Secondary | ICD-10-CM

## 2011-02-13 DIAGNOSIS — J019 Acute sinusitis, unspecified: Secondary | ICD-10-CM

## 2011-03-21 ENCOUNTER — Inpatient Hospital Stay (INDEPENDENT_AMBULATORY_CARE_PROVIDER_SITE_OTHER)
Admission: RE | Admit: 2011-03-21 | Discharge: 2011-03-21 | Disposition: A | Discharge status: Home / Self Care | Payer: 59 | Source: Ambulatory Visit | Attending: Emergency Medicine | Admitting: Emergency Medicine

## 2011-03-21 DIAGNOSIS — J069 Acute upper respiratory infection, unspecified: Secondary | ICD-10-CM

## 2011-03-22 ENCOUNTER — Encounter: Payer: Self-pay | Admitting: Family Medicine

## 2011-03-23 ENCOUNTER — Ambulatory Visit: Payer: 59 | Admitting: Family Medicine

## 2011-03-23 ENCOUNTER — Encounter: Payer: Self-pay | Admitting: Family Medicine

## 2011-03-24 ENCOUNTER — Ambulatory Visit: Payer: 59 | Admitting: Family Medicine

## 2011-03-24 ENCOUNTER — Encounter: Payer: 59 | Admitting: Family Medicine

## 2011-04-05 ENCOUNTER — Ambulatory Visit (HOSPITAL_BASED_OUTPATIENT_CLINIC_OR_DEPARTMENT_OTHER)
Admission: RE | Admit: 2011-04-05 | Discharge: 2011-04-05 | Disposition: A | Discharge status: Home / Self Care | Payer: 59 | Source: Ambulatory Visit | Attending: Plastic Surgery | Admitting: Plastic Surgery

## 2011-04-05 ENCOUNTER — Other Ambulatory Visit: Payer: Self-pay | Admitting: Plastic Surgery

## 2011-04-05 DIAGNOSIS — N62 Hypertrophy of breast: Secondary | ICD-10-CM | POA: Insufficient documentation

## 2011-04-05 LAB — POCT HEMOGLOBIN-HEMACUE: Hemoglobin: 11.5 g/dL — ABNORMAL LOW (ref 12.0–15.0)

## 2011-05-18 ENCOUNTER — Encounter: Payer: Self-pay | Admitting: Family Medicine

## 2011-05-23 ENCOUNTER — Telehealth: Payer: Self-pay | Admitting: Family Medicine

## 2011-05-23 ENCOUNTER — Ambulatory Visit (INDEPENDENT_AMBULATORY_CARE_PROVIDER_SITE_OTHER): Payer: 59 | Admitting: Family Medicine

## 2011-05-23 ENCOUNTER — Encounter: Payer: Self-pay | Admitting: Family Medicine

## 2011-05-23 VITALS — BP 110/70 | HR 91 | Resp 16 | Ht 66.0 in | Wt 233.0 lb

## 2011-05-23 DIAGNOSIS — R7302 Impaired glucose tolerance (oral): Secondary | ICD-10-CM | POA: Insufficient documentation

## 2011-05-23 DIAGNOSIS — Z13220 Encounter for screening for lipoid disorders: Secondary | ICD-10-CM

## 2011-05-23 DIAGNOSIS — N62 Hypertrophy of breast: Secondary | ICD-10-CM

## 2011-05-23 DIAGNOSIS — E785 Hyperlipidemia, unspecified: Secondary | ICD-10-CM

## 2011-05-23 DIAGNOSIS — R7309 Other abnormal glucose: Secondary | ICD-10-CM

## 2011-05-23 DIAGNOSIS — Z0000 Encounter for general adult medical examination without abnormal findings: Secondary | ICD-10-CM

## 2011-05-23 DIAGNOSIS — E669 Obesity, unspecified: Secondary | ICD-10-CM

## 2011-05-23 NOTE — Assessment & Plan Note (Signed)
unchanged Patient re-educated about  the importance of commitment to a  minimum of 150 minutes of exercise per week. The importance of healthy food choices with portion control discussed. Encouraged to start a food diary, count calories and to consider  joining a support group. Sample diet sheets offered. Goals set by the patient for the next several months.    

## 2011-05-23 NOTE — Telephone Encounter (Signed)
Yes pls refill, until I get new labs she stays on this

## 2011-05-23 NOTE — Assessment & Plan Note (Signed)
Currently on crestor due abn lipid profile with family h/o early CAD, rept labs to be obtained, would favor maintaining this med

## 2011-05-23 NOTE — Assessment & Plan Note (Signed)
The importance of weight loss and limiting carbs to delay onset of diabetes or prevent it is stressed

## 2011-05-23 NOTE — Progress Notes (Signed)
  Subjective:    Patient ID: Shawna Gonzales, female    DOB: [birth year], 30 y.o.   MRN: [id number]  HPI The PT is here for annual exam  and re-evaluation of chronic medical conditions, medication management and review of any available recent lab and radiology data.  Preventive health is updated, specifically  Cancer screening and Immunization.   Questions or concerns regarding consultations or procedures which the PT has had in the interim are  Addressed.She had succesfull breast reduction surgery at the end of September The PT denies any adverse reactions to current medications since the last visit.  There are no new concerns.  There are no specific complaints , except though just starting her cycle this is too heavy to do the pap today, this will be done at next visit. Still has not comited to regular exercise and dietary change for improved health , but intends to work on this      Review of Systems See HPI Denies recent fever or chills. Denies sinus pressure, nasal congestion, ear pain or sore throat. Denies chest congestion, productive cough or wheezing. Denies chest pains, palpitations and leg swelling Denies abdominal pain, nausea, vomiting,diarrhea or constipation.   Denies dysuria, frequency, hesitancy or incontinence. Denies joint pain, swelling and limitation in mobility. Denies headaches, seizures, numbness, or tingling. Denies depression, anxiety or insomnia. Denies skin break down or rash.        Objective:   Physical Exam Pleasant well nourished female, alert and oriented x 3, in no cardio-pulmonary distress. Afebrile. HEENT No facial trauma or asymetry. Sinuses non tender.  EOMI, PERTL, fundoscopic exam no hemorhage or exudate.  External ears normal, tympanic membranes clear. Oropharynx moist, no exudate, good dentition. Neck: supple, no adenopathy,JVD or thyromegaly.No bruits.  Chest: Clear to ascultation bilaterally.No crackles or wheezes. Non tender  to palpation  Breast: No asymetry,no masses. No nipple discharge or inversion. No axillary or supraclavicular adenopathy  Cardiovascular system; Heart sounds normal,  S1 and  S2 ,no S3.  No murmur, or thrill. Apical beat not displaced Peripheral pulses normal.  Abdomen: Soft, non tender, no organomegaly or masses. No bruits. Bowel sounds normal. No guarding, tenderness or rebound.  Rectal:  No mass. Guaiac negative stool.  GU: External genitalia normal. No lesions. Vaginal canal heavy menstrual bleeding , unable to adequately visualize or do pap.  Musculoskeletal exam: Full ROM of spine, hips , shoulders and knees. No deformity ,swelling or crepitus noted. No muscle wasting or atrophy.   Neurologic: Cranial nerves 2 to 12 intact. Power, tone ,sensation and reflexes normal throughout. No disturbance in gait. No tremor.  Skin: Intact, no ulceration, erythema , scaling or rash noted. Pigmentation normal throughout  Psych; Normal mood and affect. Judgement and concentration normal        Assessment & Plan:

## 2011-05-23 NOTE — Telephone Encounter (Signed)
Patient aware.

## 2011-05-23 NOTE — Patient Instructions (Signed)
F/u with pap only first week in March.   It is important that you exercise regularly at least 30 minutes 5 times a week. If you develop chest pain, have severe difficulty breathing, or feel very tired, stop exercising immediately and seek medical attention  .A healthy diet is rich in fruit, vegetables and whole grains. Poultry fish, nuts and beans are a healthy choice for protein rather then red meat. A low sodium diet and drinking 64 ounces of water daily is generally recommended. Oils and sweet should be limited. Carbohydrates especially for those who are diabetic or overweight, should be limited to 34-45 gram per meal. It is important to eat on a regular schedule, at least 3 times daily. Snacks should be primarily fruits, vegetables or nuts.  Fasting labs asap for this visit, and again in early March for next visit  I am happy you have had your surgery

## 2011-05-23 NOTE — Assessment & Plan Note (Signed)
sucesful breast reduction end Sept from cup size G  To C, healing well

## 2011-05-23 NOTE — Telephone Encounter (Signed)
I don't see where it was discontinued. She is to continue, correct?

## 2011-05-30 LAB — HEPATIC FUNCTION PANEL
AST: 15 U/L (ref 0–37)
Albumin: 4 g/dL (ref 3.5–5.2)
Alkaline Phosphatase: 91 U/L (ref 39–117)
Bilirubin, Direct: 0.1 mg/dL (ref 0.0–0.3)
Indirect Bilirubin: 0.3 mg/dL (ref 0.0–0.9)
Total Bilirubin: 0.4 mg/dL (ref 0.3–1.2)

## 2011-05-31 LAB — NMR LIPOPROFILE WITH LIPIDS
HDL-C: 41 mg/dL (ref >=40)
LDL (calc): 82 mg/dL (ref <100)
LDL Particle Number: 1152 nmol/L — ABNORMAL HIGH (ref <1000)
LP-IR Score: 67 — ABNORMAL HIGH (ref <=45)

## 2011-06-14 ENCOUNTER — Telehealth: Payer: Self-pay | Admitting: Family Medicine

## 2011-06-15 NOTE — Telephone Encounter (Signed)
Spoke with pt and informed her of lab results  

## 2011-07-01 ENCOUNTER — Encounter (HOSPITAL_COMMUNITY): Payer: Self-pay | Admitting: Emergency Medicine

## 2011-07-01 ENCOUNTER — Emergency Department (INDEPENDENT_AMBULATORY_CARE_PROVIDER_SITE_OTHER)
Admission: EM | Admit: 2011-07-01 | Discharge: 2011-07-01 | Disposition: A | Discharge status: Home / Self Care | Payer: 59 | Source: Home / Self Care | Attending: Family Medicine | Admitting: Family Medicine

## 2011-07-01 DIAGNOSIS — R6889 Other general symptoms and signs: Secondary | ICD-10-CM

## 2011-07-01 DIAGNOSIS — J111 Influenza due to unidentified influenza virus with other respiratory manifestations: Secondary | ICD-10-CM

## 2011-07-01 MED ORDER — IPRATROPIUM BROMIDE 0.06 % NA SOLN: 2.0000 | Freq: Four times a day (QID) | NASAL | Status: DC

## 2011-07-01 NOTE — ED Provider Notes (Signed)
History     CSN: [id number]  Arrival date & time 07/01/11  4098   First MD Initiated Contact with Patient 07/01/11 0820      Chief Complaint  Patient presents with  . Generalized Body Aches    (Consider location/radiation/quality/duration/timing/severity/associated sxs/prior treatment) Patient is a 30 y.o. female presenting with URI. The history is provided by the patient.  URI The primary symptoms include fever and myalgias. Primary symptoms do not include ear pain, sore throat, cough, nausea or vomiting. The current episode started 2 days ago. This is a new problem. The problem has not changed since onset. The onset of the illness is associated with exposure to sick contacts. Symptoms associated with the illness include congestion and rhinorrhea. The illness is not associated with chills.    Past Medical History  Diagnosis Date  . Macromastia   . Obesity   . Contraceptive management     Past Surgical History  Procedure Date  . Wisdom tooth extraction   . Breast surgery     Family History  Problem Relation Age of Onset  . Heart attack    . Hyperlipidemia Father   . Heart attack Maternal Grandfather   . Hypertension Paternal Grandfather     History  Substance Use Topics  . Smoking status: Never Smoker   . Smokeless tobacco: Not on file  . Alcohol Use: No    OB History    Grav Para Term Preterm Abortions TAB SAB Ect Mult Living                  Review of Systems  Constitutional: Positive for fever. Negative for chills and appetite change.  HENT: Positive for congestion and rhinorrhea. Negative for ear pain and sore throat.   Respiratory: Negative for cough.   Gastrointestinal: Negative.  Negative for nausea and vomiting.  Musculoskeletal: Positive for myalgias.  Skin: Negative.     Allergies  Review of patient's allergies indicates no known allergies.  Home Medications   Current Outpatient Rx  Name Route Sig Dispense Refill  . ROSUVASTATIN  CALCIUM 5 MG PO TABS Oral Take 1 tablet (5 mg total) by mouth at bedtime. 90 tablet 1  . IPRATROPIUM BROMIDE 0.06 % NA SOLN Nasal Place 2 sprays into the nose 4 (four) times daily. 15 mL 12    BP 128/64  Pulse 80  Temp(Src) 98.3 F (36.8 C) (Oral)  Resp 18  SpO2 100%  LMP 06/13/2011  Physical Exam  Nursing note and vitals reviewed. Constitutional: She is oriented to person, place, and time. She appears well-developed and well-nourished.  HENT:  Head: Normocephalic.  Right Ear: External ear normal.  Left Ear: External ear normal.  Mouth/Throat: Oropharynx is clear and moist.  Eyes: Pupils are equal, round, and reactive to light.  Neck: Normal range of motion. Neck supple.  Cardiovascular: Normal rate, normal heart sounds and intact distal pulses.   Pulmonary/Chest: Effort normal and breath sounds normal.  Abdominal: Soft. Bowel sounds are normal.  Lymphadenopathy:    She has no cervical adenopathy.  Neurological: She is alert and oriented to person, place, and time.  Skin: Skin is warm and dry.    ED Course  Procedures (including critical care time)  Labs Reviewed - No data to display No results found.   1. Influenza-like illness       MDM          Barkley Bruns, MD 07/01/11 [phone]

## 2011-07-01 NOTE — ED Notes (Signed)
Pt c/o chills and body aches for 3 days. Sent home from work 2 days ago with 101.1 temp, no fevers since. Pt has occasional cough, no sore throat or congestion.

## 2011-07-04 ENCOUNTER — Encounter: Payer: Self-pay | Admitting: Family Medicine

## 2011-07-06 ENCOUNTER — Other Ambulatory Visit (HOSPITAL_COMMUNITY)
Admission: RE | Admit: 2011-07-06 | Discharge: 2011-07-06 | Disposition: A | Discharge status: Home / Self Care | Payer: 59 | Source: Ambulatory Visit | Attending: Family Medicine | Admitting: Family Medicine

## 2011-07-06 ENCOUNTER — Ambulatory Visit (INDEPENDENT_AMBULATORY_CARE_PROVIDER_SITE_OTHER): Payer: 59 | Admitting: Family Medicine

## 2011-07-06 ENCOUNTER — Encounter: Payer: Self-pay | Admitting: Family Medicine

## 2011-07-06 VITALS — BP 120/70 | HR 72 | Resp 14 | Wt 234.0 lb

## 2011-07-06 DIAGNOSIS — E669 Obesity, unspecified: Secondary | ICD-10-CM

## 2011-07-06 DIAGNOSIS — Z01419 Encounter for gynecological examination (general) (routine) without abnormal findings: Secondary | ICD-10-CM | POA: Insufficient documentation

## 2011-07-06 DIAGNOSIS — R7302 Impaired glucose tolerance (oral): Secondary | ICD-10-CM

## 2011-07-06 DIAGNOSIS — R7309 Other abnormal glucose: Secondary | ICD-10-CM

## 2011-07-06 DIAGNOSIS — E785 Hyperlipidemia, unspecified: Secondary | ICD-10-CM

## 2011-07-06 NOTE — Patient Instructions (Signed)
F/u in early May.Cancel any sooner appt pls  Pls follow a low fat 1500 calorie diet.  Weight loss goal of 3 pounds per month.  It is important that you exercise regularly at least 30 minutes 5 times a week. If you develop chest pain, have severe difficulty breathing, or feel very tired, stop exercising immediately and seek medical attention    Fasting lipid and hepatic panel before next visit

## 2011-07-11 DIAGNOSIS — Z01419 Encounter for gynecological examination (general) (routine) without abnormal findings: Secondary | ICD-10-CM | POA: Insufficient documentation

## 2011-07-11 NOTE — Assessment & Plan Note (Signed)
Lifestyle change, exercise and weight loss stressed, will rept hBa1C before next visit

## 2011-07-11 NOTE — Assessment & Plan Note (Signed)
Unchanged. Patient re-educated about  the importance of commitment to a  minimum of 150 minutes of exercise per week. The importance of healthy food choices with portion control discussed. Encouraged to start a food diary, count calories and to consider  joining a support group. Sample diet sheets offered. Goals set by the patient for the next several months.    

## 2011-07-11 NOTE — Assessment & Plan Note (Signed)
Normal womb, no vag d/c no palpable adnexal masses

## 2011-07-11 NOTE — Progress Notes (Signed)
  Subjective:    Patient ID: Shawna Gonzales, female    DOB: May 13, 1981, 31 y.o.   MRN: [id number]  HPI Pt esssentially her for her pelvic exam which could not be done at the time of her visit due to heavy menses. No c/o vag d/c or pelvic pain. Review of weight , lipids and HBa1C are all abnormal, and the importance of lifestyle change to improve all 3 is stressed   Review of Systems See HPI Denies recent fever or chills. Denies sinus pressure, nasal congestion, ear pain or sore throat. Denies chest congestion, productive cough or wheezing.   Denies dysuria, frequency, hesitancy or incontinence.        Objective:   Physical Exam Patient alert and oriented and in no cardiopulmonary distress.  HEENT: No facial asymmetry, EOMI, .  Neck supple no adenopathy.  Chest: Clear to auscultation bilaterally.  CVS: S1, S2 no murmurs, no S3.  ABD: Soft non tender. Bowel sounds normal.  Ext: No edema  Pelvic: uterus normal size, o adnexal masses or tenderness. Normal vag d/c        Assessment & Plan:

## 2011-09-20 ENCOUNTER — Encounter (HOSPITAL_COMMUNITY): Payer: Self-pay | Admitting: Emergency Medicine

## 2011-09-20 ENCOUNTER — Emergency Department (INDEPENDENT_AMBULATORY_CARE_PROVIDER_SITE_OTHER): Payer: 59

## 2011-09-20 ENCOUNTER — Emergency Department (HOSPITAL_COMMUNITY)
Admission: EM | Admit: 2011-09-20 | Discharge: 2011-09-20 | Disposition: A | Discharge status: Home / Self Care | Payer: 59 | Source: Home / Self Care | Attending: Emergency Medicine | Admitting: Emergency Medicine

## 2011-09-20 DIAGNOSIS — M25569 Pain in unspecified knee: Secondary | ICD-10-CM

## 2011-09-20 HISTORY — DX: Pure hypercholesterolemia, unspecified: E78.00

## 2011-09-20 MED ORDER — DICLOFENAC SODIUM 75 MG PO TBEC: 75.0000 mg | DELAYED_RELEASE_TABLET | Freq: Two times a day (BID) | ORAL | Status: DC

## 2011-09-20 NOTE — ED Notes (Signed)
Left knee pain.  Reports history of left knee pain.  Reports having recent injuries.  One month ago fell in shower, landing on left knee.  Also reports falling on steps yesterday, stepping backward on steps and fell, stumbling down 4 steps and landing on left knee.  Today unable to extend knee fully and it "just doesn't feel the same".

## 2011-09-20 NOTE — ED Provider Notes (Signed)
Chief Complaint  Patient presents with  . Knee Pain    History of Present Illness:   Shawna Gonzales is a 31 year old female who presents today with left knee pain.This has been going on since some sports injuries in high school, last month she fell in the shower, striking her left knee, the pain was a little bit worse after that, then last night she tripped over a step at home and fell striking her knee again and the pain has been worse since then. She describes a dull ache over the patella and radiating to the entire knee joint. It feels stiff and it pops, sometimes it feels like it moves or shifts. She has trouble fully extending it and sometimes gives way.  Review of Systems:  Other than noted above, the patient denies any of the following symptoms: Systemic:  No fevers, chills, sweats, or aches.  No fatigue or tiredness. Musculoskeletal:  No joint pain, arthritis, bursitis, swelling, back pain, or neck pain. Neurological:  No muscular weakness, paresthesias, headache, or trouble with speech or coordination.  No dizziness.   PMFSH:  Past medical history, family history, social history, meds, and allergies were reviewed.  Physical Exam:   Vital signs:  BP 119/79  Pulse 82  Temp(Src) 98 F (36.7 C) (Oral)  Resp 17  SpO2 97%  LMP 09/13/2011 Gen:  Alert and oriented times 3.  In no distress. Musculoskeletal: She has a small abrasion just over the patella. This does not appear to be infected and there is diffuse pain to palpation of the entire knee joint including the patella and medial and lateral joint lines. The knee has a full range of motion with no crepitus. She does have some pain with full flexion. McMurray sign is negative. Cruciate and collateral ligaments are intact. Lachman sign is negative. Anterior drawer sign is negative. Otherwise, all joints had a full a ROM with no swelling, bruising or deformity.  No edema, pulses full. Extremities were warm and pink.  Capillary refill was brisk.    Skin:  Clear, warm and dry.  No rash. Neuro:  Alert and oriented times 3.  Muscle strength was normal.  Sensation was intact to light touch.   Radiology:  Dg Knee Complete 4 Views Left  09/20/2011  *RADIOLOGY REPORT*  Clinical Data: Left knee pain for 1 month.  Injured again last night.  LEFT KNEE - COMPLETE 4+ VIEW  Comparison: None.  Findings: Normal bony mineralization and alignment.  Small patellar osteophytes along the superior and inferior poles.  Medial and lateral joint spaces are maintained.  No joint effusion, fracture, suspicious bony abnormality, or discrete soft tissue swelling identified.  IMPRESSION: No acute bony abnormality or joint effusion.  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  Original Report Authenticated By: Britta Mccreedy, M.D.    Assessment:   Diagnoses that have been ruled out:  None  Diagnoses that are still under consideration:  None  Final diagnoses:  Knee pain - probably due to internal derangement of the knee such as chondromalacia patella or torn meniscus. She will need to see an orthopedist for further evaluation of this and definitive treatment including physical therapy.     Plan:   1.  The following meds were prescribed:   New Prescriptions   DICLOFENAC (VOLTAREN) 75 MG EC TABLET    Take 1 tablet (75 mg total) by mouth 2 (two) times daily.   2.  The patient was instructed in symptomatic care, including rest and activity, elevation, application  of ice and compression.  Appropriate handouts were given. 3.  The patient was told to return if becoming worse in any way, if no better in 3 or 4 days, and given some red flag symptoms that would indicate earlier return.   4.  The patient was told to follow up with Dr. Durene Romans in one week. She was given the exercises to do in the meantime.   Reuben Likes, MD 09/20/11 [phone]

## 2011-09-20 NOTE — Discharge Instructions (Signed)
Knee Pain The knee is the complex joint between your thigh and your lower leg. It is made up of bones, tendons, ligaments, and cartilage. The bones that make up the knee are:  The femur in the thigh.   The tibia and fibula in the lower leg.   The patella or kneecap riding in the groove on the lower femur.  CAUSES  Knee pain is a common complaint with many causes. A few of these causes are:  Injury, such as:   A ruptured ligament or tendon injury.   Torn cartilage.   Medical conditions, such as:   Gout   Arthritis   Infections   Overuse, over training or overdoing a physical activity.  Knee pain can be minor or severe. Knee pain can accompany debilitating injury. Minor knee problems often respond well to self-care measures or get well on their own. More serious injuries may need medical intervention or even surgery. SYMPTOMS The knee is complex. Symptoms of knee problems can vary widely. Some of the problems are:  Pain with movement and weight bearing.   Swelling and tenderness.   Buckling of the knee.   Inability to straighten or extend your knee.   Your knee locks and you cannot straighten it.   Warmth and redness with pain and fever.   Deformity or dislocation of the kneecap.  DIAGNOSIS  Determining what is wrong may be very straight forward such as when there is an injury. It can also be challenging because of the complexity of the knee. Tests to make a diagnosis may include:  Your caregiver taking a history and doing a physical exam.   Routine X-rays can be used to rule out other problems. X-rays will not reveal a cartilage tear. Some injuries of the knee can be diagnosed by:   Arthroscopy a surgical technique by which a small video camera is inserted through tiny incisions on the sides of the knee. This procedure is used to examine and repair internal knee joint problems. Tiny instruments can be used during arthroscopy to repair the torn knee cartilage  (meniscus).   Arthrography is a radiology technique. A contrast liquid is directly injected into the knee joint. Internal structures of the knee joint then become visible on X-ray film.   An MRI scan is a non x-ray radiology procedure in which magnetic fields and a computer produce two- or three-dimensional images of the inside of the knee. Cartilage tears are often visible using an MRI scanner. MRI scans have largely replaced arthrography in diagnosing cartilage tears of the knee.   Blood work.   Examination of the fluid that helps to lubricate the knee joint (synovial fluid). This is done by taking a sample out using a needle and a syringe.  TREATMENT The treatment of knee problems depends on the cause. Some of these treatments are:  Depending on the injury, proper casting, splinting, surgery or physical therapy care will be needed.   Give yourself adequate recovery time. Do not overuse your joints. If you begin to get sore during workout routines, back off. Slow down or do fewer repetitions.   For repetitive activities such as cycling or running, maintain your strength and nutrition.   Alternate muscle groups. For example if you are a weight lifter, work the upper body on one day and the lower body the next.   Either tight or weak muscles do not give the proper support for your knee. Tight or weak muscles do not absorb the stress placed   on the knee joint. Keep the muscles surrounding the knee strong.   Take care of mechanical problems.   If you have flat feet, orthotics or special shoes may help. See your caregiver if you need help.   Arch supports, sometimes with wedges on the inner or outer aspect of the heel, can help. These can shift pressure away from the side of the knee most bothered by osteoarthritis.   A brace called an "unloader" brace also may be used to help ease the pressure on the most arthritic side of the knee.   If your caregiver has prescribed crutches, braces,  wraps or ice, use as directed. The acronym for this is PRICE. This means protection, rest, ice, compression and elevation.   Nonsteroidal anti-inflammatory drugs (NSAID's), can help relieve pain. But if taken immediately after an injury, they may actually increase swelling. Take NSAID's with food in your stomach. Stop them if you develop stomach problems. Do not take these if you have a history of ulcers, stomach pain or bleeding from the bowel. Do not take without your caregiver's approval if you have problems with fluid retention, heart failure, or kidney problems.   For ongoing knee problems, physical therapy may be helpful.   Glucosamine and chondroitin are over-the-counter dietary supplements. Both may help relieve the pain of osteoarthritis in the knee. These medicines are different from the usual anti-inflammatory drugs. Glucosamine may decrease the rate of cartilage destruction.   Injections of a corticosteroid drug into your knee joint may help reduce the symptoms of an arthritis flare-up. They may provide pain relief that lasts a few months. You may have to wait a few months between injections. The injections do have a small increased risk of infection, water retention and elevated blood sugar levels.   Hyaluronic acid injected into damaged joints may ease pain and provide lubrication. These injections may work by reducing inflammation. A series of shots may give relief for as long as 6 months.   Topical painkillers. Applying certain ointments to your skin may help relieve the pain and stiffness of osteoarthritis. Ask your pharmacist for suggestions. Many over the-counter products are approved for temporary relief of arthritis pain.   In some countries, doctors often prescribe topical NSAID's for relief of chronic conditions such as arthritis and tendinitis. A review of treatment with NSAID creams found that they worked as well as oral medications but without the serious side effects.    PREVENTION  Maintain a healthy weight. Extra pounds put more strain on your joints.   Get strong, stay limber. Weak muscles are a common cause of knee injuries. Stretching is important. Include flexibility exercises in your workouts.   Be smart about exercise. If you have osteoarthritis, chronic knee pain or recurring injuries, you may need to change the way you exercise. This does not mean you have to stop being active. If your knees ache after jogging or playing basketball, consider switching to swimming, water aerobics or other low-impact activities, at least for a few days a week. Sometimes limiting high-impact activities will provide relief.   Make sure your shoes fit well. Choose footwear that is right for your sport.   Protect your knees. Use the proper gear for knee-sensitive activities. Use kneepads when playing volleyball or laying carpet. Buckle your seat belt every time you drive. Most shattered kneecaps occur in car accidents.   Rest when you are tired.  SEEK MEDICAL CARE IF:  You have knee pain that is continual and does not   seem to be getting better.  SEEK IMMEDIATE MEDICAL CARE IF:  Your knee joint feels hot to the touch and you have a high fever. MAKE SURE YOU:   Understand these instructions.   Will watch your condition.   Will get help right away if you are not doing well or get worse.  Document Released: 04/17/2007 Document Revised: 06/09/2011 Document Reviewed: 04/17/2007 [employer] Patient Information 2012 [employer]. 

## 2011-11-02 ENCOUNTER — Encounter: Payer: Self-pay | Admitting: Family Medicine

## 2011-11-02 ENCOUNTER — Ambulatory Visit (INDEPENDENT_AMBULATORY_CARE_PROVIDER_SITE_OTHER): Payer: 59 | Admitting: Family Medicine

## 2011-11-02 VITALS — BP 110/70 | HR 83 | Resp 16 | Ht 66.0 in | Wt 235.0 lb

## 2011-11-02 DIAGNOSIS — E669 Obesity, unspecified: Secondary | ICD-10-CM

## 2011-11-02 DIAGNOSIS — J309 Allergic rhinitis, unspecified: Secondary | ICD-10-CM | POA: Insufficient documentation

## 2011-11-02 DIAGNOSIS — R7302 Impaired glucose tolerance (oral): Secondary | ICD-10-CM

## 2011-11-02 DIAGNOSIS — R5381 Other malaise: Secondary | ICD-10-CM

## 2011-11-02 DIAGNOSIS — R7309 Other abnormal glucose: Secondary | ICD-10-CM

## 2011-11-02 DIAGNOSIS — E785 Hyperlipidemia, unspecified: Secondary | ICD-10-CM

## 2011-11-02 DIAGNOSIS — R7301 Impaired fasting glucose: Secondary | ICD-10-CM

## 2011-11-02 LAB — HEPATIC FUNCTION PANEL
AST: 17 U/L (ref 0–37)
Alkaline Phosphatase: 89 U/L (ref 39–117)
Indirect Bilirubin: 0.4 mg/dL (ref 0.0–0.9)
Total Protein: 6.4 g/dL (ref 6.0–8.3)

## 2011-11-02 MED ORDER — FLUTICASONE PROPIONATE 50 MCG/ACT NA SUSP: 2.0000 | Freq: Every day | NASAL | Status: DC

## 2011-11-02 MED ORDER — ROSUVASTATIN CALCIUM 5 MG PO TABS: 5.0000 mg | ORAL_TABLET | Freq: Every day | ORAL | Status: DC

## 2011-11-02 MED ORDER — PHENTERMINE HCL 37.5 MG PO TABS: 37.5000 mg | ORAL_TABLET | Freq: Every day | ORAL | Status: DC

## 2011-11-02 NOTE — Assessment & Plan Note (Signed)
Uncontrolled symptoms in past 3 to 4 weeks, add flonase to claritin

## 2011-11-02 NOTE — Patient Instructions (Signed)
F/u in 2 months   It is important that you exercise regularly at least 30 minutes 5 times a week. If you develop chest pain, have severe difficulty breathing, or feel very tired, stop exercising immediately and seek medical attention    A healthy diet is rich in fruit, vegetables and whole grains. Poultry fish, nuts and beans are a healthy choice for protein rather then red meat. A low sodium diet and drinking 64 ounces of water daily is generally recommended. Oils and sweet should be limited. Carbohydrates especially for those who are diabetic or overweight, should be limited to 30-45 gram per meal. It is important to eat on a regular schedule, at least 3 times daily. Snacks should be primarily fruits, vegetables or nuts.  Weight loss goal of 3 to 4 pounds per month   BREAK phentermine in half take HALF tablet once daily  New spray for allergies

## 2011-11-02 NOTE — Assessment & Plan Note (Signed)
Unchanged. Patient re-educated about  the importance of commitment to a  minimum of 150 minutes of exercise per week. The importance of healthy food choices with portion control discussed. Encouraged to start a food diary, count calories and to consider  joining a support group. Sample diet sheets offered. Goals set by the patient for the next several months.    

## 2011-11-02 NOTE — Assessment & Plan Note (Signed)
Importance of low carb diet with weight loss stressed

## 2011-11-02 NOTE — Progress Notes (Signed)
  Subjective:    Patient ID: Shawna Gonzales, female    DOB: Feb 07, 1981, 31 y.o.   MRN: [id number]  HPI The PT is here for follow up and re-evaluation of chronic medical conditions, medication management and review of any available recent lab and radiology data.  Preventive health is updated, specifically  Cancer screening and Immunization.   Questions or concerns regarding consultations or procedures which the PT has had in the interim are  addressed. The PT denies any adverse reactions to current medications since the last visit.  There are no new concerns.  C/o increased and uncontrolled allergy symptoms. Concerned about failure to lose weight, will work on starting exercise and better calorie restriction. Also wants phentermine    Review of Systems See HPI Denies recent fever or chills. Denies sinus pressure, nasal congestion, ear pain or sore throat. Denies chest congestion, productive cough or wheezing. Denies chest pains, palpitations and leg swelling Denies abdominal pain, nausea, vomiting,diarrhea or constipation.   Denies dysuria, frequency, hesitancy or incontinence. Denies joint pain, swelling and limitation in mobility. Denies headaches, seizures, numbness, or tingling. Denies depression, anxiety or insomnia. Denies skin break down or rash.        Objective:   Physical Exam Patient alert and oriented and in no cardiopulmonary distress.  HEENT: No facial asymmetry, EOMI, no sinus tenderness,  oropharynx pink and moist.  Neck supple no adenopathy.  Chest: Clear to auscultation bilaterally.  CVS: S1, S2 no murmurs, no S3.  ABD: Soft non tender. Bowel sounds normal.  Ext: No edema  MS: Adequate ROM spine, shoulders, hips and knees.  Skin: Intact, no ulcerations or rash noted.  Psych: Good eye contact, normal affect. Memory intact not anxious or depressed appearing.  CNS: CN 2-12 intact, power, tone and sensation normal throughout.        Assessment  & Plan:

## 2011-11-02 NOTE — Assessment & Plan Note (Signed)
Hyperlipidemia:Low fat diet discussed and encouraged.  Recent labs to be reviewed when available

## 2011-11-03 ENCOUNTER — Ambulatory Visit: Payer: 59 | Admitting: Family Medicine

## 2011-11-03 LAB — NMR LIPOPROFILE WITH LIPIDS
Cholesterol, Total: 146 mg/dL (ref <200)
HDL Particle Number: 28.3 umol/L — ABNORMAL LOW (ref >=30.5)
HDL-C: 41 mg/dL (ref >=40)
LDL (calc): 86 mg/dL (ref <100)
LDL Particle Number: 1233 nmol/L — ABNORMAL HIGH (ref <1000)
LP-IR Score: 71 — ABNORMAL HIGH (ref <=45)
Triglycerides: 94 mg/dL (ref <150)

## 2011-12-01 DIAGNOSIS — E7800 Pure hypercholesterolemia, unspecified: Secondary | ICD-10-CM | POA: Insufficient documentation

## 2011-12-01 DIAGNOSIS — E669 Obesity, unspecified: Secondary | ICD-10-CM | POA: Insufficient documentation

## 2011-12-01 DIAGNOSIS — I880 Nonspecific mesenteric lymphadenitis: Secondary | ICD-10-CM | POA: Insufficient documentation

## 2011-12-01 DIAGNOSIS — R1031 Right lower quadrant pain: Secondary | ICD-10-CM | POA: Insufficient documentation

## 2011-12-01 DIAGNOSIS — R110 Nausea: Secondary | ICD-10-CM | POA: Insufficient documentation

## 2011-12-02 ENCOUNTER — Emergency Department (HOSPITAL_COMMUNITY)
Admission: EM | Admit: 2011-12-02 | Discharge: 2011-12-02 | Disposition: A | Discharge status: Home / Self Care | Payer: 59 | Attending: Emergency Medicine | Admitting: Emergency Medicine

## 2011-12-02 ENCOUNTER — Emergency Department (HOSPITAL_COMMUNITY): Payer: 59

## 2011-12-02 ENCOUNTER — Encounter (HOSPITAL_COMMUNITY): Payer: Self-pay | Admitting: Emergency Medicine

## 2011-12-02 DIAGNOSIS — I880 Nonspecific mesenteric lymphadenitis: Secondary | ICD-10-CM

## 2011-12-02 LAB — CBC
HCT: 39.3 % (ref 36.0–46.0)
MCH: 27.3 pg (ref 26.0–34.0)
MCV: 82.4 fL (ref 78.0–100.0)
RBC: 4.77 MIL/uL (ref 3.87–5.11)
RDW: 13.1 % (ref 11.5–15.5)
WBC: 13.8 10*3/uL — ABNORMAL HIGH (ref 4.0–10.5)

## 2011-12-02 LAB — URINALYSIS, ROUTINE W REFLEX MICROSCOPIC
Bilirubin Urine: NEGATIVE
Hgb urine dipstick: NEGATIVE
Ketones, ur: NEGATIVE mg/dL
Specific Gravity, Urine: 1.018 (ref 1.005–1.030)
Urobilinogen, UA: 0.2 mg/dL (ref 0.0–1.0)

## 2011-12-02 LAB — POCT I-STAT, CHEM 8
BUN: 4 mg/dL — ABNORMAL LOW (ref 6–23)
Calcium, Ion: 1.27 mmol/L (ref 1.12–1.32)
Creatinine, Ser: 0.9 mg/dL (ref 0.50–1.10)
Glucose, Bld: 82 mg/dL (ref 70–99)
TCO2: 28 mmol/L (ref 0–100)

## 2011-12-02 LAB — DIFFERENTIAL
Basophils Relative: 0 % (ref 0–1)
Eosinophils Absolute: 0.1 10*3/uL (ref 0.0–0.7)
Eosinophils Relative: 1 % (ref 0–5)
Monocytes Absolute: 0.5 10*3/uL (ref 0.1–1.0)
Monocytes Relative: 4 % (ref 3–12)

## 2011-12-02 LAB — BASIC METABOLIC PANEL
BUN: 6 mg/dL (ref 6–23)
CO2: 28 mEq/L (ref 19–32)
Chloride: 99 mEq/L (ref 96–112)
Creatinine, Ser: 0.81 mg/dL (ref 0.50–1.10)

## 2011-12-02 MED ORDER — IOHEXOL 300 MG/ML  SOLN
100.0000 mL | Freq: Once | INTRAMUSCULAR | Status: AC | PRN
  Administered 2011-12-02: 100 mL via INTRAVENOUS

## 2011-12-02 MED ORDER — KETOROLAC TROMETHAMINE 30 MG/ML IJ SOLN
30.0000 mg | Freq: Once | INTRAMUSCULAR | Status: AC
  Administered 2011-12-02: 30 mg via INTRAVENOUS
  Filled 2011-12-02: qty 1

## 2011-12-02 MED ORDER — ONDANSETRON HCL 4 MG/2ML IJ SOLN
4.0000 mg | Freq: Once | INTRAMUSCULAR | Status: AC
  Administered 2011-12-02: 4 mg via INTRAVENOUS
  Filled 2011-12-02 (×2): qty 2

## 2011-12-02 MED ORDER — MORPHINE SULFATE 4 MG/ML IJ SOLN
4.0000 mg | Freq: Once | INTRAMUSCULAR | Status: AC
  Administered 2011-12-02: 4 mg via INTRAVENOUS
  Filled 2011-12-02: qty 1

## 2011-12-02 MED ORDER — SODIUM CHLORIDE 0.9 % IV BOLUS (SEPSIS)
1000.0000 mL | Freq: Once | INTRAVENOUS | Status: AC
  Administered 2011-12-02: 1000 mL via INTRAVENOUS

## 2011-12-02 NOTE — ED Provider Notes (Signed)
History     CSN: [id number]  Arrival date & time 12/01/11  2319   First MD Initiated Contact with Patient 12/02/11 [phone]      Chief Complaint  Patient presents with  . Abdominal Pain  . n/v     (Consider location/radiation/quality/duration/timing/severity/associated sxs/prior treatment) HPI Comments: Patient states she was awakened, yesterday afternoon from a nap with diffuse abdominal discomfort.  That has progressed throughout the day to right lower quadrant pain, nausea, with no vomiting.  No change in bowel habits.  Reports pain, radiating to the right lower quadrant with movement, unable to stand up straight decrease in appetite  Patient is a 31 y.o. female presenting with abdominal pain. The history is provided by the patient.  Abdominal Pain The primary symptoms of the illness include abdominal pain and nausea. The primary symptoms of the illness do not include fever, vomiting, diarrhea, dysuria or vaginal discharge. The current episode started yesterday. The onset of the illness was gradual. The problem has been gradually worsening.  The patient states that she believes she is currently not pregnant. The patient has not had a change in bowel habit. Additional symptoms associated with the illness include anorexia. Symptoms associated with the illness do not include chills, constipation, urgency, frequency or back pain.    Past Medical History  Diagnosis Date  . Macromastia   . Obesity   . Contraceptive management   . High cholesterol     Past Surgical History  Procedure Date  . Wisdom tooth extraction   . Breast surgery     Family History  Problem Relation Age of Onset  . Heart attack    . Hyperlipidemia Father   . Heart attack Maternal Grandfather   . Hypertension Paternal Grandfather     History  Substance Use Topics  . Smoking status: Never Smoker   . Smokeless tobacco: Not on file  . Alcohol Use: No    OB History    Grav Para Term Preterm Abortions TAB  SAB Ect Mult Living                  Review of Systems  Constitutional: Negative for fever and chills.  HENT: Negative for rhinorrhea.   Gastrointestinal: Positive for nausea, abdominal pain and anorexia. Negative for vomiting, diarrhea and constipation.  Genitourinary: Negative for dysuria, urgency, frequency and vaginal discharge.  Musculoskeletal: Negative for back pain.  Neurological: Negative for dizziness.    Allergies  Review of patient's allergies indicates no known allergies.  Home Medications   Current Outpatient Rx  Name Route Sig Dispense Refill  . FLUTICASONE PROPIONATE 50 MCG/ACT NA SUSP Nasal Place 2 sprays into the nose daily. 16 g 2  . IBUPROFEN 200 MG PO TABS Oral Take 200 mg by mouth every 6 (six) hours as needed.    . IPRATROPIUM BROMIDE 0.06 % NA SOLN Nasal Place 2 sprays into the nose 4 (four) times daily. 15 mL 12  . ADULT MULTIVITAMIN W/MINERALS CH Oral Take 1 tablet by mouth daily.    Marland Kitchen PHENTERMINE HCL 37.5 MG PO TABS Oral Take 1 tablet (37.5 mg total) by mouth daily before breakfast. 30 tablet 0  . ROSUVASTATIN CALCIUM 5 MG PO TABS Oral Take 1 tablet (5 mg total) by mouth at bedtime. 90 tablet 1    BP 142/71  Pulse 100  Temp(Src) 98.8 F (37.1 C) (Oral)  Resp 18  SpO2 100%  LMP 11/17/2011  Physical Exam  Constitutional: She appears well-developed and well-nourished.  HENT:  Head: Normocephalic.  Eyes: Pupils are equal, round, and reactive to light.  Neck: Normal range of motion.  Cardiovascular: Normal rate.   Pulmonary/Chest: Effort normal.  Abdominal: She exhibits no distension and no mass. There is tenderness in the right lower quadrant. There is no rebound and no guarding.  Musculoskeletal: Normal range of motion.  Neurological: She is alert.  Skin: Skin is warm.    ED Course  Procedures (including critical care time)  Labs Reviewed  CBC - Abnormal; Notable for the following:    WBC 13.8 (*)    All other components within normal  limits  BASIC METABOLIC PANEL - Abnormal; Notable for the following:    Potassium 3.4 (*)    All other components within normal limits  POCT I-STAT, CHEM 8 - Abnormal; Notable for the following:    BUN 4 (*)    All other components within normal limits  DIFFERENTIAL - Abnormal; Notable for the following:    Neutro Abs 9.4 (*)    All other components within normal limits  URINALYSIS, ROUTINE W REFLEX MICROSCOPIC  PREGNANCY, URINE   Ct Abdomen Pelvis W Contrast  12/02/2011  *RADIOLOGY REPORT*  Clinical Data: Right lower quadrant pain.  White cell count 13.8.  CT ABDOMEN AND PELVIS WITH CONTRAST  Technique:  Multidetector CT imaging of the abdomen and pelvis was performed following the standard protocol during bolus administration of intravenous contrast.  Contrast: OMNIPAQUE IOHEXOL 300 MG/ML  SOLN  Comparison: None.  Findings: Mild dependent changes in the lung bases.  Tiny sub-centimeter low attenuation changes in the liver most likely represent small cysts.  Inflammatory process is not entirely excluded.  The spleen size is normal.  The gallbladder, pancreas, adrenal glands, abdominal aorta, and retroperitoneal lymph nodes are unremarkable.  Small cysts in the kidneys.  No solid mass or hydronephrosis.  Small accessory spleens.  The stomach, small bowel, and colon are not abnormally distended.  Mesenteric lymph nodes are mildly prominent suggesting reactive or inflammatory nodes.  Largest lymph nodes measure up to about 14 mm short axis dimension.  No free air or free fluid in the abdomen.  Pelvis:  The appendix is normal. Mild nodular prominence of the posterior uterus suggesting fibroids.  The adnexal structures are not enlarged.  There is a small amount of free fluid in the pelvis. This may be physiologic.  Bladder wall is not thickened.  No evidence of diverticulitis.  Normal alignment of the lumbar vertebrae.  IMPRESSION: The appendix is normal.  There is a moderately prominent lymph nodes  throughout the mesentery and in the right lower quadrant. Changes may be reactive nodes.  Consider mesenteric adenitis.  Original Report Authenticated By: Marlon Pel, M.D.     1. Mesenteric adenitis       MDM   Patient's last mental period was May 11 which puts her in the  right time frame for an ovarian cyst,  but her exam is inconsistent for that  And is  consistent for  acute appendicitis        Arman Filter, NP 12/02/11 0310  Arman Filter, NP 12/02/11 1610  Arman Filter, NP 12/02/11 0539  Arman Filter, NP 12/02/11 [phone]

## 2011-12-02 NOTE — ED Notes (Signed)
Pt states she does not want Morphine. Dondra Spry informed. Pt sitting upright drinking contrast.

## 2011-12-02 NOTE — ED Notes (Signed)
Patient transported to CT 

## 2011-12-02 NOTE — Discharge Instructions (Signed)
Mesenteric Adenitis  Mesenteric adenitis is an inflammation of lymph nodes (glands) in the abdomen. It may appear to mimic appendicitis symptoms. It is most common in children. The cause of this may be an infection somewhere else in the body. It usually gets well without treatment but can cause problems for up to a couple weeks.  SYMPTOMS   The most common problems are:   Fever.   Abdominal pain and tenderness.   Nausea, vomiting, and/or diarrhea.  DIAGNOSIS   Your caregiver may have an idea what is wrong by examining you or your child. Sometimes lab work and other studies such as Ultrasonography and a CT scan of the abdomen are done.   TREATMENT   Children with mesenteric adenitis will get well without further treatment. Treatment includes rest, pain medications, and fluids.  HOME CARE INSTRUCTIONS    Do not take or give laxatives unless ordered by your caregiver.   Use pain medications as directed.   Follow the diet recommended by your caregiver.  SEEK IMMEDIATE MEDICAL CARE IF:    The pain does not go away or becomes severe.   An oral temperature above 102 F (38.9 C) develops.   Repeated vomiting occurs.   The pain becomes localized in the right lower quadrant of the abdomen (possibly appendicitis).   You or your child notice bright red or black tarry stools.  MAKE SURE YOU:    Understand these instructions.   Will watch your condition.   Will get help right away if you are not doing well or get worse.  Document Released: 03/24/2006 Document Revised: 06/09/2011 Document Reviewed: 04/06/2006  [employer] Patient Information 2012 [employer].

## 2011-12-02 NOTE — ED Notes (Signed)
Pt presented to the Er with c/o right lower pain, reports n/v, sx started around 2pm, pt describes pain as cramping and knots.

## 2011-12-03 NOTE — ED Provider Notes (Signed)
Medical screening examination/treatment/procedure(s) were performed by non-physician practitioner and as supervising physician I was immediately available for consultation/collaboration.   Lyanne Co, MD 12/03/11 [phone]

## 2012-01-06 ENCOUNTER — Ambulatory Visit: Payer: 59 | Admitting: Family Medicine

## 2012-01-13 ENCOUNTER — Ambulatory Visit (INDEPENDENT_AMBULATORY_CARE_PROVIDER_SITE_OTHER): Payer: 59 | Admitting: Family Medicine

## 2012-01-13 ENCOUNTER — Encounter: Payer: Self-pay | Admitting: Family Medicine

## 2012-01-13 VITALS — BP 124/82 | HR 76 | Resp 16 | Ht 66.0 in | Wt 229.1 lb

## 2012-01-13 DIAGNOSIS — R7302 Impaired glucose tolerance (oral): Secondary | ICD-10-CM

## 2012-01-13 DIAGNOSIS — E785 Hyperlipidemia, unspecified: Secondary | ICD-10-CM

## 2012-01-13 DIAGNOSIS — E669 Obesity, unspecified: Secondary | ICD-10-CM

## 2012-01-13 DIAGNOSIS — R7309 Other abnormal glucose: Secondary | ICD-10-CM

## 2012-01-13 MED ORDER — ROSUVASTATIN CALCIUM 10 MG PO TABS: 10.0000 mg | ORAL_TABLET | Freq: Every day | ORAL | Status: DC

## 2012-01-13 MED ORDER — PHENTERMINE HCL 37.5 MG PO TABS: 37.5000 mg | ORAL_TABLET | Freq: Every day | ORAL | Status: DC

## 2012-01-13 NOTE — Patient Instructions (Addendum)
F/u in 3 month  Continue half phentermine once daily  It is important that you exercise regularly at least  45 minutes 5 times a week. If you develop chest pain, have severe difficulty breathing, or feel very tired, stop exercising immediately and seek medical attention   A healthy diet is rich in fruit, vegetables and whole grains. Poultry fish,  and beans are a healthy choice for protein rather then red meat. A low sodium diet and drinking 64 ounces of water daily is generally recommended. Oils and sweet should be limited. Carbohydrates especially for those who are diabetic or overweight, should be limited to 30-45 gram per meal. It is important to eat on a regular schedule, at least 3 times daily. Snacks should be primarily fruits, vegetables   Dose increase in crestor to 10 mg daily, please take two 5mg  tablets daily till done  Aim of weight loss is 4 to 5 pounds [per month  HBA1, lipid and hepatic in 3 month  2 week before visit

## 2012-01-15 NOTE — Progress Notes (Signed)
  Subjective:    Patient ID: Shawna Gonzales, female    DOB: 10/04/1980, 31 y.o.   MRN: [id number]  HPI The PT is here for follow up and re-evaluation of chronic medical conditions, medication management and review of any available recent lab and radiology data.  Preventive health is updated, specifically  Cancer screening and Immunization.   Questions or concerns regarding consultations or procedures which the PT has had in the interim are  addressed. The PT denies any adverse reactions to current medications since the last visit.  Increased stress currently due to reduced work hours, seeking additional job opportunities, unable to remain as focused as she had been on exercise, will re commit. There are no specific complaints      Review of Systems See HPI Denies recent fever or chills. Denies sinus pressure, nasal congestion, ear pain or sore throat. Denies chest congestion, productive cough or wheezing. Denies chest pains, palpitations and leg swelling Denies abdominal pain, nausea, vomiting,diarrhea or constipation.   Denies dysuria, frequency, hesitancy or incontinence. Denies joint pain, swelling and limitation in mobility. Denies headaches, seizures, numbness, or tingling. Denies depression, anxiety or insomnia. Denies skin break down or rash.        Objective:   Physical Exam Patient alert and oriented and in no cardiopulmonary distress.  HEENT: No facial asymmetry, EOMI, no sinus tenderness,  oropharynx pink and moist.  Neck supple no adenopathy.  Chest: Clear to auscultation bilaterally.  CVS: S1, S2 no murmurs, no S3.  ABD: Soft non tender. Bowel sounds normal.  Ext: No edema  MS: Adequate ROM spine, shoulders, hips and knees.  Skin: Intact, no ulcerations or rash noted.  Psych: Good eye contact, normal affect. Memory intact not anxious or depressed appearing.  CNS: CN 2-12 intact, power, tone and sensation normal throughout.        Assessment &  Plan:

## 2012-01-15 NOTE — Assessment & Plan Note (Signed)
Improved. Pt applauded on succesful weight loss through lifestyle change, and encouraged to continue same. Weight loss goal set for the next several months. Recommitment to increased physical activity , minimum of 150 minutes per week encouraged

## 2012-01-15 NOTE — Assessment & Plan Note (Signed)
High risk of premature CAD, lipid profile has deteriorated, increase dose of crestor, and recommitment to low fat diet

## 2012-01-16 ENCOUNTER — Other Ambulatory Visit: Payer: Self-pay

## 2012-01-16 DIAGNOSIS — E785 Hyperlipidemia, unspecified: Secondary | ICD-10-CM

## 2012-02-16 ENCOUNTER — Encounter: Payer: Self-pay | Admitting: Family Medicine

## 2012-02-16 ENCOUNTER — Ambulatory Visit (INDEPENDENT_AMBULATORY_CARE_PROVIDER_SITE_OTHER): Payer: 59 | Admitting: Family Medicine

## 2012-02-16 VITALS — BP 116/66 | HR 78 | Temp 98.1°F | Resp 18 | Ht 66.0 in | Wt 225.0 lb

## 2012-02-16 DIAGNOSIS — J069 Acute upper respiratory infection, unspecified: Secondary | ICD-10-CM

## 2012-02-16 DIAGNOSIS — J019 Acute sinusitis, unspecified: Secondary | ICD-10-CM

## 2012-02-16 MED ORDER — METHYLPREDNISOLONE ACETATE 40 MG/ML IJ SUSP
40.0000 mg | Freq: Once | INTRAMUSCULAR | Status: AC
  Administered 2012-02-16: 40 mg via INTRAMUSCULAR

## 2012-02-16 MED ORDER — CHLORPHENIRAMINE-HYDROCODONE 8-10 MG/5ML PO LQCR: 5.0000 mL | Freq: Two times a day (BID) | ORAL | Status: DC | PRN

## 2012-02-16 MED ORDER — FLUCONAZOLE 150 MG PO TABS: 150.0000 mg | ORAL_TABLET | Freq: Once | ORAL | Status: DC

## 2012-02-16 MED ORDER — AMOXICILLIN-POT CLAVULANATE 875-125 MG PO TABS: 1.0000 | ORAL_TABLET | Freq: Two times a day (BID) | ORAL | Status: DC

## 2012-02-16 NOTE — Progress Notes (Signed)
  Subjective:    Patient ID: Shawna Gonzales, female    DOB: October 02, 1980, 31 y.o.   MRN: [id number]  HPI   Nasal congestion and pressure x 1 week, now with green drainage and cough. No fever, using flonase, benadryl without relief, cough keeps her up at night. Works as Lawyer at [employer].    Review of Systems - per above   [employer]- denies fatigue, fever, weight loss,weakness, recent illness HEENT- denies eye drainage, change in vision, +nasal discharge, +sore throat , + ear stuffiness CVS- denies chest pain, palpitations RESP- denies SOB, +cough, wheeze ABD- denies N/V, change in stools, abd pain Neuro- denies headache, dizziness, syncope, seizure activity      Objective:   Physical Exam GEN- NAD, alert and oriented x3 HEENT- PERRL, EOMI, non injected sclera, pink conjunctiva, MMM, oropharynx injected no exudates, TM clear bilat, no effusion , + maxillary sinus tenderness  Neck- Supple, no LAD  CVS- RRR, no murmur RESP-CTAB EXT- No edema Pulses- Radial 2+         Assessment & Plan:    Acute sinuitis- Antibiotics, flonase to be continued, depo Medrol in office   Diflucan as pt gets yeast infections after antibiotics    URI- , may be secondary to post nasal drip, tussionex as keeping pt up at night

## 2012-02-16 NOTE — Addendum Note (Signed)
Addended by: Kandis Fantasia B on: 02/16/2012 12:59 PM   Modules accepted: Orders

## 2012-02-16 NOTE — Patient Instructions (Addendum)
Start the antibiotics  Continue flonse Steroid shot given Cough medications Call if you do not improve   Sinusitis Sinusitis an infection of the air pockets (sinuses) in your face. This can cause puffiness (swelling). It can also cause drainage from your sinuses.   HOME CARE    Only take medicine as told by your doctor.   Drink enough fluids to keep your pee (urine) clear or pale yellow.   Apply moist heat or ice packs for pain relief.   Use salt (saline) nose sprays. The spray will wet the thick fluid in the nose. This can help the sinuses drain.  GET HELP RIGHT AWAY IF:    You have a fever.   Your baby is older than 3 months with a rectal temperature of 102 F (38.9 C) or higher.   Your baby is 56 months old or younger with a rectal temperature of 100.4 F (38 C) or higher.   The pain gets worse.   You get a very bad headache.   You keep throwing up (vomiting).   Your face gets puffy.  MAKE SURE YOU:    Understand these instructions.   Will watch your condition.   Will get help right away if you are not doing well or get worse.  Document Released: 12/07/2007 Document Revised: 06/09/2011 Document Reviewed: 12/07/2007 [facility] Patient Information 2012 [place], [place].

## 2012-04-10 ENCOUNTER — Ambulatory Visit: Payer: 59 | Admitting: Family Medicine

## 2012-04-11 ENCOUNTER — Encounter: Payer: Self-pay | Admitting: Family Medicine

## 2012-04-26 ENCOUNTER — Encounter (HOSPITAL_COMMUNITY): Payer: Self-pay | Admitting: Emergency Medicine

## 2012-04-26 ENCOUNTER — Emergency Department (INDEPENDENT_AMBULATORY_CARE_PROVIDER_SITE_OTHER)
Admission: EM | Admit: 2012-04-26 | Discharge: 2012-04-26 | Disposition: A | Discharge status: Home / Self Care | Payer: 59 | Source: Home / Self Care | Attending: Family Medicine | Admitting: Family Medicine

## 2012-04-26 DIAGNOSIS — J029 Acute pharyngitis, unspecified: Secondary | ICD-10-CM

## 2012-04-26 LAB — POCT RAPID STREP A: Streptococcus, Group A Screen (Direct): NEGATIVE

## 2012-04-26 MED ORDER — IBUPROFEN 600 MG PO TABS: 600.0000 mg | ORAL_TABLET | Freq: Three times a day (TID) | ORAL | Status: DC

## 2012-04-26 MED ORDER — CEPHALEXIN 500 MG PO CAPS: 500.0000 mg | ORAL_CAPSULE | Freq: Two times a day (BID) | ORAL | Status: DC

## 2012-04-26 NOTE — ED Notes (Signed)
Waiting discharge papers 

## 2012-04-26 NOTE — ED Notes (Signed)
Pt c/o sore throat that started around 2 a.m and noticed white patches in back of throat.   Pt is having some nasal drainage.  Denies any other symptoms.

## 2012-04-28 NOTE — ED Provider Notes (Signed)
History     CSN: [id number]  Arrival date & time 04/26/12  1191   First MD Initiated Contact with Patient 04/26/12 [phone]      Chief Complaint  Patient presents with  . Sore Throat    (Consider location/radiation/quality/duration/timing/severity/associated sxs/prior treatment) HPI Comments: 31 y/o female h/o allergic rhinitis here c/o sore throat and pain with swallowing first notice in am today.Patient had experienced nasal congestion and drainage several days prior started with sore throat denies fever. Noticed white spots in throat today. Decreased appetite. No cough. No diffuculty breathing or chest pain.   Past Medical History  Diagnosis Date  . Macromastia   . Obesity   . Contraceptive management   . High cholesterol     Past Surgical History  Procedure Date  . Wisdom tooth extraction   . Breast surgery     Family History  Problem Relation Age of Onset  . Heart attack    . Hyperlipidemia Father   . Heart attack Maternal Grandfather   . Hypertension Paternal Grandfather     History  Substance Use Topics  . Smoking status: Never Smoker   . Smokeless tobacco: Not on file  . Alcohol Use: No    OB History    Grav Para Term Preterm Abortions TAB SAB Ect Mult Living                  Review of Systems  Constitutional: Positive for appetite change. Negative for fever, chills, diaphoresis and fatigue.  HENT: Positive for congestion, sore throat, rhinorrhea, trouble swallowing and neck pain. Negative for ear pain and neck stiffness.   Eyes: Negative for discharge.  Respiratory: Negative for cough and shortness of breath.   Cardiovascular: Negative for chest pain.  Gastrointestinal: Negative for nausea, vomiting, abdominal pain and diarrhea.  Musculoskeletal: Negative for arthralgias.  Skin: Negative for rash.  Neurological: Negative for headaches.    Allergies  Review of patient's allergies indicates no known allergies.  Home Medications   Current  Outpatient Rx  Name Route Sig Dispense Refill  . ROSUVASTATIN CALCIUM 10 MG PO TABS Oral Take 1 tablet (10 mg total) by mouth at bedtime. 90 tablet 3    Dose increase effective 01/13/2012  . CEPHALEXIN 500 MG PO CAPS Oral Take 1 capsule (500 mg total) by mouth 2 (two) times daily. 20 capsule 0  . CHLORPHENIRAMINE-HYDROCODONE 8-10 MG/5ML PO LQCR Oral Take 5 mLs by mouth every 12 (twelve) hours as needed for cough. 120 mL 0  . FLUTICASONE PROPIONATE 50 MCG/ACT NA SUSP Nasal Place 2 sprays into the nose daily. 16 g 2  . IBUPROFEN 200 MG PO TABS Oral Take 200 mg by mouth every 6 (six) hours as needed.    . IBUPROFEN 600 MG PO TABS Oral Take 1 tablet (600 mg total) by mouth 3 (three) times daily. 20 tablet 0  . ADULT MULTIVITAMIN W/MINERALS CH Oral Take 1 tablet by mouth daily.    Marland Kitchen PHENTERMINE HCL 37.5 MG PO TABS Oral Take 1 tablet (37.5 mg total) by mouth daily before breakfast. 30 tablet 1    BP 129/75  Pulse 82  Temp 98.1 F (36.7 C) (Oral)  Resp 12  SpO2 98%  LMP 04/06/2012  Physical Exam  Nursing note and vitals reviewed. Constitutional: She is oriented to person, place, and time. She appears well-developed and well-nourished. No distress.  HENT:       Nasal Congestion with erythema and swelling of nasal turbinates, no rhinorrhea. Significant pharyngeal  and tonsillar erythema with white no exudates. No uvula deviation. No trismus. TM's normal.   Eyes: Conjunctivae normal are normal. Right eye exhibits no discharge. Left eye exhibits no discharge.  Neck: Normal range of motion. Neck supple.  Cardiovascular: Normal rate, regular rhythm and normal heart sounds.   No murmur heard. Pulmonary/Chest: Effort normal and breath sounds normal.  Abdominal: Soft. There is no tenderness.       No splenomegaly.  Lymphadenopathy:    She has cervical adenopathy.  Neurological: She is alert and oriented to person, place, and time.  Skin: No rash noted. She is not diaphoretic.    ED Course    Procedures (including critical care time)   Labs Reviewed  POCT RAPID STREP A ([facility] CARE ONLY)  LAB REPORT - SCANNED   No results found.   1. Exudative pharyngitis       MDM  Exudative tonsillo-pharyngitis decided to cover with keflex despite neg rapid strep;  prescribed ibuprofen for pain. Asked to return if persistent or worsening symptoms despite following treatment.         Sharin Grave, MD 04/29/12 1610

## 2012-05-02 ENCOUNTER — Other Ambulatory Visit: Payer: Self-pay | Admitting: Family Medicine

## 2012-05-02 ENCOUNTER — Telehealth: Payer: Self-pay | Admitting: Family Medicine

## 2012-05-02 MED ORDER — NORGESTIM-ETH ESTRAD TRIPHASIC 0.18/0.215/0.25 MG-35 MCG PO TABS: 1.0000 | ORAL_TABLET | Freq: Every day | ORAL | Status: DC

## 2012-05-02 MED ORDER — NORGESTIM-ETH ESTRAD TRIPHASIC 0.18/0.215/0.25 MG-25 MCG PO TABS: 1.0000 | ORAL_TABLET | Freq: Every day | ORAL | Status: DC

## 2012-05-02 NOTE — Telephone Encounter (Signed)
rx entered for orthotricyclen lo, pls let her know then fax in 3 month supply, remind her to sched to come see me for her pap in January end, when due

## 2012-05-02 NOTE — Telephone Encounter (Signed)
Changed and sent in

## 2012-05-02 NOTE — Telephone Encounter (Signed)
Please DO change and let her know thanks

## 2012-05-02 NOTE — Telephone Encounter (Signed)
Pt aware and med sent  

## 2012-05-02 NOTE — Telephone Encounter (Signed)
[facility] pharmacy told her the ortho Lo that was sent was $100 but the regular ortho would be free. Ok to change?

## 2012-07-16 ENCOUNTER — Other Ambulatory Visit: Payer: Self-pay | Admitting: Family Medicine

## 2012-07-23 ENCOUNTER — Telehealth: Payer: Self-pay | Admitting: Family Medicine

## 2012-07-23 DIAGNOSIS — R7301 Impaired fasting glucose: Secondary | ICD-10-CM

## 2012-07-23 DIAGNOSIS — E785 Hyperlipidemia, unspecified: Secondary | ICD-10-CM

## 2012-07-23 NOTE — Telephone Encounter (Signed)
Labs mailed

## 2012-07-23 NOTE — Telephone Encounter (Signed)
Noted. Labs reordered and mailed.

## 2012-08-06 LAB — HEPATIC FUNCTION PANEL
ALT: 13 U/L (ref 0–35)
AST: 11 U/L (ref 0–37)
Albumin: 3.8 g/dL (ref 3.5–5.2)
Alkaline Phosphatase: 83 U/L (ref 39–117)
Total Protein: 6.3 g/dL (ref 6.0–8.3)

## 2012-08-06 LAB — LIPID PANEL
Cholesterol: 142 mg/dL (ref 0–200)
HDL: 44 mg/dL (ref >39)
LDL Cholesterol: 67 mg/dL (ref 0–99)
Total CHOL/HDL Ratio: 3.2 Ratio
Triglycerides: 156 mg/dL — ABNORMAL HIGH (ref <150)
VLDL: 31 mg/dL (ref 0–40)

## 2012-08-09 ENCOUNTER — Encounter: Payer: Self-pay | Admitting: Family Medicine

## 2012-08-09 ENCOUNTER — Ambulatory Visit (INDEPENDENT_AMBULATORY_CARE_PROVIDER_SITE_OTHER): Payer: 59 | Admitting: Family Medicine

## 2012-08-09 ENCOUNTER — Other Ambulatory Visit (HOSPITAL_COMMUNITY)
Admission: RE | Admit: 2012-08-09 | Discharge: 2012-08-09 | Disposition: A | Discharge status: Home / Self Care | Payer: 59 | Source: Ambulatory Visit | Attending: Family Medicine | Admitting: Family Medicine

## 2012-08-09 VITALS — BP 118/76 | HR 95 | Resp 18 | Ht 66.0 in | Wt 228.1 lb

## 2012-08-09 DIAGNOSIS — Z125 Encounter for screening for malignant neoplasm of prostate: Secondary | ICD-10-CM

## 2012-08-09 DIAGNOSIS — E785 Hyperlipidemia, unspecified: Secondary | ICD-10-CM

## 2012-08-09 DIAGNOSIS — Z130 Encounter for screening for diseases of the blood and blood-forming organs and certain disorders involving the immune mechanism: Secondary | ICD-10-CM

## 2012-08-09 DIAGNOSIS — R87810 Cervical high risk human papillomavirus (HPV) DNA test positive: Secondary | ICD-10-CM | POA: Insufficient documentation

## 2012-08-09 DIAGNOSIS — M542 Cervicalgia: Secondary | ICD-10-CM | POA: Insufficient documentation

## 2012-08-09 DIAGNOSIS — Z139 Encounter for screening, unspecified: Secondary | ICD-10-CM

## 2012-08-09 DIAGNOSIS — Z1321 Encounter for screening for nutritional disorder: Secondary | ICD-10-CM

## 2012-08-09 DIAGNOSIS — Z01419 Encounter for gynecological examination (general) (routine) without abnormal findings: Secondary | ICD-10-CM | POA: Insufficient documentation

## 2012-08-09 DIAGNOSIS — Z1151 Encounter for screening for human papillomavirus (HPV): Secondary | ICD-10-CM | POA: Insufficient documentation

## 2012-08-09 DIAGNOSIS — R7309 Other abnormal glucose: Secondary | ICD-10-CM

## 2012-08-09 DIAGNOSIS — Z0000 Encounter for general adult medical examination without abnormal findings: Secondary | ICD-10-CM

## 2012-08-09 DIAGNOSIS — R7302 Impaired glucose tolerance (oral): Secondary | ICD-10-CM

## 2012-08-09 MED ORDER — CYCLOBENZAPRINE HCL 10 MG PO TABS: 10.0000 mg | ORAL_TABLET | Freq: Every evening | ORAL | Status: AC | PRN

## 2012-08-09 MED ORDER — NAPROXEN 375 MG PO TABS: 375.0000 mg | ORAL_TABLET | Freq: Two times a day (BID) | ORAL | Status: DC

## 2012-08-09 NOTE — Patient Instructions (Addendum)
F/u in 4.5 month  Please commit to 30 minutes exercise daily.  Change in diet to low fat with a lot of fruit and vegetables, drink water   Naproxen sent in for right neck pain and spasm and flexeril , take as directed for 10 days only  No cause for concern re skin tag as we discussed  Fasting lipid, cmp , vit D , TSH and CBc in 4.5 month

## 2012-08-11 DIAGNOSIS — Z0000 Encounter for general adult medical examination without abnormal findings: Secondary | ICD-10-CM | POA: Insufficient documentation

## 2012-08-11 NOTE — Assessment & Plan Note (Signed)
Acute right neck pain and spasm, anti inflammatories and muscle relaxant prescribed

## 2012-08-11 NOTE — Progress Notes (Signed)
  Subjective:    Patient ID: Shawna Gonzales, female    DOB: 1980-10-01, 32 y.o.   MRN: [id number]  HPI The PT is here for annual exam and re-evaluation of chronic medical conditions, medication management and review of any available recent lab and radiology data.  Preventive health is updated, specifically  Cancer screening and Immunization.   Questions or concerns regarding consultations or procedures which the PT has had in the interim are  addressed. The PT denies any adverse reactions to current medications since the last visit.  3 day h/o right neck pain and spasm awoke with the pain, bedding needs attention/pillows , she believes. C/o skin lesion on right inner thigh, no drainage, pain or redness present for several months, no major change in size       Review of Systems    See HPI Denies recent fever or chills. Denies sinus pressure, nasal congestion, ear pain or sore throat. Denies chest congestion, productive cough or wheezing. Denies chest pains, palpitations and leg swelling Denies abdominal pain, nausea, vomiting,diarrhea or constipation.   Denies dysuria, frequency, hesitancy or incontinence.  Denies headaches, seizures, numbness, or tingling. Denies depression, anxiety or insomnia.    Objective:   Physical Exam  Pleasant well nourished female, alert and oriented x 3, in no cardio-pulmonary distress. Afebrile. HEENT No facial trauma or asymetry. Sinuses non tender.  EOMI, PERTL, fundoscopic exam is normal, no hemorhage or exudate.  External ears normal, tympanic membranes clear. Oropharynx moist, no exudate, good dentition. Neck: decreased ROM with right trapezius spasm, no adenopathy,JVD or thyromegaly.No bruits.  Chest: Clear to ascultation bilaterally.No crackles or wheezes. Non tender to palpation  Breast: No asymetry,no masses. No nipple discharge or inversion.Well healed surgical scars from breast reduction surgery No axillary or supraclavicular  adenopathy  Cardiovascular system; Heart sounds normal,  S1 and  S2 ,no S3.  No murmur, or thrill. Apical beat not displaced Peripheral pulses normal.  Abdomen: Soft, non tender, no organomegaly or masses. No bruits. Bowel sounds normal. No guarding, tenderness or rebound.   GU: External genitalia normal. No lesions. Vaginal canal normal.physiologic  discharge. Uterus normal size, no adnexal masses, no cervical motion or adnexal tenderness.  Musculoskeletal exam: Full ROM of thoraco lumbar  spine, hips , shoulders and knees. No deformity ,swelling or crepitus noted. No muscle wasting or atrophy.   Neurologic: Cranial nerves 2 to 12 intact. Power, tone ,sensation and reflexes normal throughout. No disturbance in gait. No tremor.  Skin: Intact, no ulceration, erythema , scaling or rash noted.Skin tag noted on right inner thigh Pigmentation normal throughout  Psych; Normal mood and affect. Judgement and concentration normal       Assessment & Plan:

## 2012-08-11 NOTE — Assessment & Plan Note (Signed)
Annual exam completed as documented. Pap sent. Counseling re the need for weight loss and regular physical activity with change in diet to achieve this is documented also

## 2012-08-18 ENCOUNTER — Other Ambulatory Visit: Payer: Self-pay

## 2012-10-10 ENCOUNTER — Encounter: Payer: Self-pay | Admitting: Family Medicine

## 2012-10-10 ENCOUNTER — Ambulatory Visit (INDEPENDENT_AMBULATORY_CARE_PROVIDER_SITE_OTHER): Payer: 59 | Admitting: Family Medicine

## 2012-10-10 VITALS — BP 118/80 | HR 80 | Resp 16 | Ht 66.0 in | Wt 231.0 lb

## 2012-10-10 DIAGNOSIS — E669 Obesity, unspecified: Secondary | ICD-10-CM

## 2012-10-10 DIAGNOSIS — R7309 Other abnormal glucose: Secondary | ICD-10-CM

## 2012-10-10 DIAGNOSIS — N3000 Acute cystitis without hematuria: Secondary | ICD-10-CM

## 2012-10-10 DIAGNOSIS — R7302 Impaired glucose tolerance (oral): Secondary | ICD-10-CM

## 2012-10-10 DIAGNOSIS — N390 Urinary tract infection, site not specified: Secondary | ICD-10-CM

## 2012-10-10 LAB — POCT URINALYSIS DIPSTICK
Leukocytes, UA: NEGATIVE
Nitrite, UA: NEGATIVE
Protein, UA: NEGATIVE
pH, UA: 8

## 2012-10-10 MED ORDER — CIPROFLOXACIN HCL 500 MG PO TABS: 500.0000 mg | ORAL_TABLET | Freq: Two times a day (BID) | ORAL | Status: DC

## 2012-10-10 MED ORDER — FLUCONAZOLE 150 MG PO TABS: ORAL_TABLET | ORAL | Status: DC

## 2012-10-10 MED ORDER — CIPROFLOXACIN HCL 500 MG PO TABS: 500.0000 mg | ORAL_TABLET | Freq: Two times a day (BID) | ORAL | Status: AC

## 2012-10-10 NOTE — Patient Instructions (Addendum)
F/u as before.  I believe you have a UTI, cipro is sent in for 5 days, also the urine will be sent for culture. Fluconazole is prescribed in the event you get a yeast infection  It is important that you exercise regularly at least 30 minutes 5 times a week. If you develop chest pain, have severe difficulty breathing, or feel very tired, stop exercising immediately and seek medical attention    Tylenol ids ok for back pin up to 2 tabs daily  Work excuse from 4/8 to return 10/11/2012  Drink at least 60 ounces water daily, and empty often

## 2012-10-10 NOTE — Assessment & Plan Note (Signed)
Deteriorated. Patient re-educated about  the importance of commitment to a  minimum of 150 minutes of exercise per week. The importance of healthy food choices with portion control discussed. Encouraged to start a food diary, count calories and to consider  joining a support group. Sample diet sheets offered. Goals set by the patient for the next several months.    

## 2012-10-10 NOTE — Assessment & Plan Note (Signed)
Updated lab next visit Patient educated about the importance of limiting  Carbohydrate intake , the need to commit to daily physical activity for a minimum of 30 minutes , and to commit weight loss. The fact that changes in all these areas will reduce or eliminate all together the development of diabetes is stressed.   Marland Kitchen

## 2012-10-10 NOTE — Progress Notes (Signed)
  Subjective:    Patient ID: Shawna Gonzales, female    DOB: 1981-01-17, 32 y.o.   MRN: [id number]  HPI  4 day h/o urinary frequency , up to 15 times overnight, 3 times in 1 hr , small amts, lower abdominal pain , also back and flank pain , no fever , chills or vomiting Out of work yesterday. Has increased water and cranberry juice intake with a little relief No vag d/c noted, denies dyspareunia  Review of Systems See HPI Denies recent fever or chills. Denies sinus pressure, nasal congestion, ear pain or sore throat. Denies chest congestion, productive cough or wheezing. Denies chest pains, palpitations and leg swelling Denies  nausea, vomiting,diarrhea or constipation.     insomnia due to disturbed sleep Denies skin break down or rash.        Objective:   Physical Exam  Patient alert and oriented and in no cardiopulmonary distress.Appears unconmfortable  HEENT: No facial asymmetry, EOMI, no sinus tenderness,  oropharynx pink and moist.  Neck supple no adenopathy.  Chest: Clear to auscultation bilaterally.  CVS: S1, S2 no murmurs, no S3.  ABD: Soft mild flank tenderness and suprapubic tenderness.  Ext: No edema  MS: Adequate ROM spine, shoulders, hips and knees.       Assessment & Plan:

## 2012-10-10 NOTE — Assessment & Plan Note (Signed)
Acute cystitis by symptoms, though UA not very abnormal will treat presumptively with antibiotics x 5 days and f/u c/s. Out of work x 2 days

## 2012-10-14 LAB — URINE CULTURE

## 2012-11-09 ENCOUNTER — Encounter: Payer: Self-pay | Admitting: Family Medicine

## 2012-11-09 ENCOUNTER — Other Ambulatory Visit: Payer: Self-pay | Admitting: Family Medicine

## 2012-11-09 MED ORDER — PHENTERMINE HCL 37.5 MG PO TABS: 37.5000 mg | ORAL_TABLET | Freq: Every day | ORAL | Status: DC

## 2012-12-06 ENCOUNTER — Encounter: Payer: Self-pay | Admitting: Family Medicine

## 2012-12-06 ENCOUNTER — Ambulatory Visit: Payer: 59 | Admitting: Family Medicine

## 2012-12-07 ENCOUNTER — Encounter: Payer: Self-pay | Admitting: Family Medicine

## 2012-12-07 ENCOUNTER — Ambulatory Visit (INDEPENDENT_AMBULATORY_CARE_PROVIDER_SITE_OTHER): Payer: 59 | Admitting: Family Medicine

## 2012-12-07 VITALS — BP 120/78 | HR 93 | Resp 16 | Ht 66.0 in | Wt 232.8 lb

## 2012-12-07 DIAGNOSIS — R7302 Impaired glucose tolerance (oral): Secondary | ICD-10-CM

## 2012-12-07 DIAGNOSIS — E669 Obesity, unspecified: Secondary | ICD-10-CM

## 2012-12-07 DIAGNOSIS — R109 Unspecified abdominal pain: Secondary | ICD-10-CM

## 2012-12-07 DIAGNOSIS — N3000 Acute cystitis without hematuria: Secondary | ICD-10-CM

## 2012-12-07 DIAGNOSIS — K3189 Other diseases of stomach and duodenum: Secondary | ICD-10-CM

## 2012-12-07 DIAGNOSIS — R1013 Epigastric pain: Secondary | ICD-10-CM

## 2012-12-07 DIAGNOSIS — E785 Hyperlipidemia, unspecified: Secondary | ICD-10-CM

## 2012-12-07 DIAGNOSIS — R7309 Other abnormal glucose: Secondary | ICD-10-CM

## 2012-12-07 LAB — POCT URINALYSIS DIPSTICK
Bilirubin, UA: NEGATIVE
Blood, UA: NEGATIVE
Glucose, UA: NEGATIVE
Nitrite, UA: NEGATIVE
Spec Grav, UA: 1.025
Urobilinogen, UA: 0.2

## 2012-12-07 LAB — BASIC METABOLIC PANEL
BUN: 8 mg/dL (ref 6–23)
CO2: 26 mEq/L (ref 19–32)
Calcium: 9.6 mg/dL (ref 8.4–10.5)
Chloride: 102 mEq/L (ref 96–112)
Creat: 0.83 mg/dL (ref 0.50–1.10)

## 2012-12-07 LAB — LIPID PANEL
Cholesterol: 146 mg/dL (ref 0–200)
HDL: 42 mg/dL (ref >39)
LDL Cholesterol: 66 mg/dL (ref 0–99)
Triglycerides: 190 mg/dL — ABNORMAL HIGH (ref <150)
VLDL: 38 mg/dL (ref 0–40)

## 2012-12-07 MED ORDER — PANTOPRAZOLE SODIUM 20 MG PO TBEC: 20.0000 mg | DELAYED_RELEASE_TABLET | Freq: Every day | ORAL | Status: DC

## 2012-12-07 NOTE — Patient Instructions (Addendum)
F/u in 4 month, cancel sooner. Call if you need me before  You are referred for ultrasound of both kidneys  Lipid, chem 7,cpk and ckmb  H pylori labwork today and urine to be checked for infection and  Blood, also   Hold off of crestor till labs are in but I do not at all believe that your symptoms are related to the crestor  Start protoniix for reflux and heartburn this is sent in  You are referred for ultrasound of your kidneys

## 2012-12-07 NOTE — Progress Notes (Signed)
  Subjective:    Patient ID: Shawna Gonzales, female    DOB: June 15, 1981, 32 y.o.   MRN: [id number]  HPI Dull aching pain in both flanks also in epigastrium,no specific aggravating factor, but when it occurs, upper body movement is limited. No h/o kidney stones, no h/o hematuria, does report urinary frequency Felt that pain was due to crestor and stopped this   Review of Systems See HPI Denies recent fever or chills. Denies sinus pressure, nasal congestion, ear pain or sore throat. Denies chest congestion, productive cough or wheezing. Denies chest pains, palpitations and leg swelling Denies nausea, vomiting,diarrhea or constipation.   Denies joint pain or  swelling does have limitation in mobility. Denies headaches, seizures, numbness, or tingling. Denies depression, anxiety or insomnia. Denies skin break down or rash.        Objective:   Physical Exam Patient alert and oriented and in no cardiopulmonary distress.  HEENT: No facial asymmetry, EOMI, no sinus tenderness,  oropharynx pink and moist.  Neck supple no adenopathy.  Chest: Clear to auscultation bilaterally.  CVS: S1, S2 no murmurs, no S3.  ABD: Soft mild epigastric tenderness, and bilateral flank tenderness. Bowel sounds normal.  Ext: No edema  MS: Adequate ROM lumbar  Spine,decreased ROM thoracic spine, adequate ROM in  shoulders, hips and knees.  Skin: Intact, no ulcerations or rash noted.  Psych: Good eye contact, normal affect. Memory intact not anxious or depressed appearing.  CNS: CN 2-12 intact, power, tone and sensation normal throughout.        Assessment & Plan:

## 2012-12-10 LAB — H. PYLORI ANTIBODY, IGG: H Pylori IgG: <0.4 {ISR}

## 2012-12-13 ENCOUNTER — Ambulatory Visit: Payer: 59 | Admitting: Family Medicine

## 2012-12-13 ENCOUNTER — Other Ambulatory Visit (HOSPITAL_COMMUNITY): Payer: 59

## 2012-12-14 ENCOUNTER — Ambulatory Visit (HOSPITAL_COMMUNITY): Admission: RE | Admit: 2012-12-14 | Payer: 59 | Source: Ambulatory Visit

## 2012-12-22 NOTE — Assessment & Plan Note (Signed)
Bilateral flank pain, renal US to evaluate same

## 2012-12-22 NOTE — Assessment & Plan Note (Signed)
Patient educated about the importance of limiting  Carbohydrate intake , the need to commit to daily physical activity for a minimum of 30 minutes , and to commit weight loss. The fact that changes in all these areas will reduce or eliminate all together the development of diabetes is stressed.    

## 2012-12-22 NOTE — Assessment & Plan Note (Signed)
Urinalysis negative for infection or blood despite presenting complaint

## 2012-12-22 NOTE — Assessment & Plan Note (Signed)
Deteriorated and Mother died of Mi under age 31 Hyperlipidemia:Low fat diet discussed and encouraged.   Resume crestor

## 2012-12-22 NOTE — Assessment & Plan Note (Signed)
Deteriorated. Patient re-educated about  the importance of commitment to a  minimum of 150 minutes of exercise per week. The importance of healthy food choices with portion control discussed. Encouraged to start a food diary, count calories and to consider  joining a support group. Sample diet sheets offered. Goals set by the patient for the next several months.    

## 2012-12-22 NOTE — Assessment & Plan Note (Signed)
Re check h pylori to see if abdomnial symptoms are related to infection

## 2012-12-24 ENCOUNTER — Telehealth: Payer: Self-pay | Admitting: Family Medicine

## 2012-12-31 NOTE — Telephone Encounter (Signed)
Noted, thjs is in reference to an ultrasound

## 2013-01-17 ENCOUNTER — Encounter: Payer: Self-pay | Admitting: Family Medicine

## 2013-01-28 ENCOUNTER — Emergency Department (HOSPITAL_COMMUNITY)
Admission: EM | Admit: 2013-01-28 | Discharge: 2013-01-28 | Disposition: A | Discharge status: Home / Self Care | Payer: 59 | Source: Home / Self Care

## 2013-01-28 ENCOUNTER — Encounter (HOSPITAL_COMMUNITY): Payer: Self-pay | Admitting: *Deleted

## 2013-01-28 DIAGNOSIS — R05 Cough: Secondary | ICD-10-CM

## 2013-01-28 DIAGNOSIS — R059 Cough, unspecified: Secondary | ICD-10-CM

## 2013-01-28 DIAGNOSIS — J029 Acute pharyngitis, unspecified: Secondary | ICD-10-CM

## 2013-01-28 HISTORY — DX: Gastro-esophageal reflux disease without esophagitis: K21.9

## 2013-01-28 MED ORDER — AZITHROMYCIN 250 MG PO TABS: ORAL_TABLET | ORAL | Status: DC

## 2013-01-28 MED ORDER — GUAIFENESIN-CODEINE 100-10 MG/5ML PO SOLN: 5.0000 mL | Freq: Three times a day (TID) | ORAL | Status: DC | PRN

## 2013-01-28 MED ORDER — MELOXICAM 15 MG PO TABS: 15.0000 mg | ORAL_TABLET | Freq: Every day | ORAL | Status: DC

## 2013-01-28 NOTE — ED Provider Notes (Signed)
Shawna Gonzales is a 32 y.o. female who presents to Urgent Care today for sore throat cough congestion and productive cough present for the last 5 days. No dyspnea chest pains or palpitations. She's tried several over-the-counter medications which have helped a bit. She denies any fevers or chills and feels well otherwise.  She works as a Lawyer at [employer] however denies any specific sick contacts.    PMH reviewed. Hyperlipidemia and GERD History  Substance Use Topics  . Smoking status: Never Smoker   . Smokeless tobacco: Not on file  . Alcohol Use: No   ROS as above Medications reviewed. No current facility-administered medications for this encounter.   Current Outpatient Prescriptions  Medication Sig Dispense Refill  . azithromycin (ZITHROMAX) 250 MG tablet Take first 2 tablets together, then 1 every day until finished.  6 tablet  0  . guaiFENesin-codeine 100-10 MG/5ML syrup Take 5 mLs by mouth 3 (three) times daily as needed for cough.  120 mL  0  . meloxicam (MOBIC) 15 MG tablet Take 1 tablet (15 mg total) by mouth daily.  14 tablet  0  . Multiple Vitamin (MULITIVITAMIN WITH MINERALS) TABS Take 1 tablet by mouth daily.      . naproxen (NAPROSYN) 375 MG tablet Take 1 tablet (375 mg total) by mouth 2 (two) times daily with a meal.  60 tablet  0  . pantoprazole (PROTONIX) 20 MG tablet Take 1 tablet (20 mg total) by mouth daily.  90 tablet  1  . rosuvastatin (CRESTOR) 10 MG tablet Take 1 tablet (10 mg total) by mouth at bedtime.  90 tablet  3  . TRI-SPRINTEC 0.18/0.215/0.25 MG-35 MCG tablet TAKE 1 TABLET BY MOUTH DAILY  84 tablet  PRN    Exam:  BP 118/79  Pulse 89  Temp(Src) 98.9 F (37.2 C)  SpO2 98%  LMP 12/20/2012 Gen: Well NAD HEENT: EOMI,  MMM, normal-appearing tympanic membranes bilaterally. Posterior pharynx is mildly erythematous without exudate. No significant lymphadenopathy in anterior cervical lymph nodes.  Lungs: CTABL Nl WOB Heart: RRR no MRG Abd:  NABS, NT, ND Exts: Non edematous BL  LE, warm and well perfused.   Results for orders placed during the hospital encounter of 01/28/13 (from the past 24 hour(s))  POCT RAPID STREP A ([facility] ONLY)     Status: None   Collection Time    01/28/13  8:38 AM      Result Value Range   Streptococcus, Group A Screen (Direct) NEGATIVE  NEGATIVE   No results found.  Assessment and Plan: 31 y.o. female with viral pharyngitis and cough.  Plan to treat symptomatically with codeine containing cough medication and meloxicam for pain. Will prescribe a azithromycin course in case patient does not improve in a week or so. Will put primary care provider if not better.  Discussed warning signs or symptoms. Please see discharge instructions. Patient expresses understanding.      Rodolph Bong, MD 01/28/13 [phone]

## 2013-01-28 NOTE — ED Notes (Signed)
Pt is here with complaints of 5 day history of sore throat and cough with green sputum.

## 2013-01-30 LAB — CULTURE, GROUP A STREP

## 2013-02-13 ENCOUNTER — Encounter: Payer: Self-pay | Admitting: Family Medicine

## 2013-02-14 ENCOUNTER — Encounter: Payer: Self-pay | Admitting: Family Medicine

## 2013-02-14 ENCOUNTER — Telehealth: Payer: Self-pay | Admitting: Family Medicine

## 2013-02-14 ENCOUNTER — Telehealth: Payer: Self-pay

## 2013-02-14 DIAGNOSIS — Z113 Encounter for screening for infections with a predominantly sexual mode of transmission: Secondary | ICD-10-CM

## 2013-02-14 NOTE — Telephone Encounter (Signed)
Mailed today

## 2013-02-14 NOTE — Telephone Encounter (Signed)
Labs ordered and message left to call me back and let me know where she wanted them sent or if she wanted to come in and collect

## 2013-02-19 ENCOUNTER — Other Ambulatory Visit: Payer: Self-pay | Admitting: Family Medicine

## 2013-02-19 LAB — COMPREHENSIVE METABOLIC PANEL
ALT: 12 U/L (ref 0–35)
AST: 12 U/L (ref 0–37)
Alkaline Phosphatase: 76 U/L (ref 39–117)
CO2: 30 mEq/L (ref 19–32)
Creat: 0.9 mg/dL (ref 0.50–1.10)
Sodium: 138 mEq/L (ref 135–145)
Total Bilirubin: 0.5 mg/dL (ref 0.3–1.2)

## 2013-02-19 LAB — LIPID PANEL
Cholesterol: 117 mg/dL (ref 0–200)
LDL Cholesterol: 42 mg/dL (ref 0–99)
Total CHOL/HDL Ratio: 2.4 Ratio
VLDL: 26 mg/dL (ref 0–40)

## 2013-02-19 LAB — CBC
MCH: 27.1 pg (ref 26.0–34.0)
MCHC: 33.7 g/dL (ref 30.0–36.0)
MCV: 80.4 fL (ref 78.0–100.0)
Platelets: 324 10*3/uL (ref 150–400)
RDW: 14.2 % (ref 11.5–15.5)

## 2013-02-19 LAB — HIV ANTIBODY (ROUTINE TESTING W REFLEX): HIV: NONREACTIVE

## 2013-02-20 ENCOUNTER — Encounter: Payer: Self-pay | Admitting: Family Medicine

## 2013-02-20 DIAGNOSIS — A6004 Herpesviral vulvovaginitis: Secondary | ICD-10-CM | POA: Insufficient documentation

## 2013-02-20 LAB — VITAMIN D 25 HYDROXY (VIT D DEFICIENCY, FRACTURES): Vit D, 25-Hydroxy: 35 ng/mL (ref 30–89)

## 2013-02-20 LAB — HSV 2 ANTIBODY, IGG: HSV 2 Glycoprotein G Ab, IgG: 7.05 IV — ABNORMAL HIGH

## 2013-02-21 ENCOUNTER — Telehealth: Payer: Self-pay | Admitting: Family Medicine

## 2013-02-21 ENCOUNTER — Encounter: Payer: Self-pay | Admitting: Family Medicine

## 2013-02-22 ENCOUNTER — Ambulatory Visit (INDEPENDENT_AMBULATORY_CARE_PROVIDER_SITE_OTHER): Payer: 59 | Admitting: Family Medicine

## 2013-02-22 ENCOUNTER — Encounter: Payer: Self-pay | Admitting: Family Medicine

## 2013-02-22 VITALS — BP 112/80 | HR 62 | Resp 16 | Ht 66.0 in | Wt 226.0 lb

## 2013-02-22 DIAGNOSIS — E669 Obesity, unspecified: Secondary | ICD-10-CM

## 2013-02-22 DIAGNOSIS — A6004 Herpesviral vulvovaginitis: Secondary | ICD-10-CM

## 2013-02-22 NOTE — Telephone Encounter (Signed)
Patient aware of lab results.

## 2013-02-22 NOTE — Progress Notes (Signed)
  Subjective:    Patient ID: Shawna Gonzales, female    DOB: 07-04-1981, 32 y.o.   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 When discussing this with her on the phone yesterday, she was obviously upset and crying, hence the decision to bring her in, which she was happy about. Her current rel;ationship is 31 years old, on and off, she has never c/o genital ulcers or stinging, but recently requested blood tests for "everything" which yielded this positive result. Now wonders when she was exposed, I advised , since she had never been tested before , she will never know , esp as she is asymptomatic. Since the call , she had spoken with her boyfriend who was supportive, and is going to be tested. She is opting to wait on his result, as if he too is posisitive , there will be no need to suppressive therapy. I Re iterated that testing for this virus in not considered "routine" that up to 28% of some populations in the [place] test positive, and that without symptoms, the main indication for testing is in pregnancy , she understands. She has been working on lifestyle change with great weight loss success to date   Review of Systems See HPI No complaints or concerns. Specifically denies stinging, burning or ulceration in vaginal area, denies discharge    Objective:   Physical Exam  Patient alert and oriented and in no cardiopulmonary distress.  Well appearing. No focused exam at this visit , which is essentially a consultation re abn labs.       Assessment & Plan:

## 2013-02-22 NOTE — Patient Instructions (Addendum)
F/u as before  Congrats on weight loss, please keep it up  I am glad that you have a better understanding of lab results, keep well!

## 2013-02-23 NOTE — Assessment & Plan Note (Signed)
Pt asymptomatic, in am longstanding relationship, has discussed with her partner, and will wait on his test result to see if she needs to take suppressive therapy

## 2013-02-23 NOTE — Assessment & Plan Note (Signed)
Improved. Pt applauded on succesful weight loss through lifestyle change, and encouraged to continue same. Weight loss goal set for the next several months.  

## 2013-03-06 ENCOUNTER — Other Ambulatory Visit: Payer: Self-pay

## 2013-03-06 ENCOUNTER — Encounter: Payer: Self-pay | Admitting: Family Medicine

## 2013-03-06 ENCOUNTER — Other Ambulatory Visit: Payer: Self-pay | Admitting: Family Medicine

## 2013-03-06 DIAGNOSIS — J309 Allergic rhinitis, unspecified: Secondary | ICD-10-CM

## 2013-03-06 DIAGNOSIS — M542 Cervicalgia: Secondary | ICD-10-CM

## 2013-03-06 MED ORDER — FLUTICASONE PROPIONATE 50 MCG/ACT NA SUSP: 2.0000 | Freq: Every day | NASAL | Status: DC

## 2013-03-06 MED ORDER — NAPROXEN 375 MG PO TABS: 375.0000 mg | ORAL_TABLET | Freq: Two times a day (BID) | ORAL | Status: DC

## 2013-04-03 ENCOUNTER — Encounter: Payer: Self-pay | Admitting: Family Medicine

## 2013-04-03 NOTE — Telephone Encounter (Signed)
Please advise 

## 2013-04-04 ENCOUNTER — Other Ambulatory Visit: Payer: Self-pay | Admitting: Family Medicine

## 2013-04-04 MED ORDER — FLUCONAZOLE 150 MG PO TABS: ORAL_TABLET | ORAL | Status: DC

## 2013-04-16 ENCOUNTER — Ambulatory Visit: Payer: 59 | Admitting: Family Medicine

## 2013-05-09 ENCOUNTER — Other Ambulatory Visit: Payer: Self-pay

## 2013-05-23 ENCOUNTER — Encounter: Payer: Self-pay | Admitting: Family Medicine

## 2013-07-06 ENCOUNTER — Encounter: Payer: Self-pay | Admitting: Family Medicine

## 2013-07-06 DIAGNOSIS — R7302 Impaired glucose tolerance (oral): Secondary | ICD-10-CM

## 2013-07-06 DIAGNOSIS — E785 Hyperlipidemia, unspecified: Secondary | ICD-10-CM

## 2013-07-06 DIAGNOSIS — E669 Obesity, unspecified: Secondary | ICD-10-CM

## 2013-07-08 ENCOUNTER — Other Ambulatory Visit: Payer: Self-pay

## 2013-07-08 DIAGNOSIS — E785 Hyperlipidemia, unspecified: Secondary | ICD-10-CM

## 2013-07-08 MED ORDER — ROSUVASTATIN CALCIUM 10 MG PO TABS: 10.0000 mg | ORAL_TABLET | Freq: Every day | ORAL | Status: DC

## 2013-07-09 NOTE — Telephone Encounter (Signed)
Concerns addressed!

## 2013-07-12 ENCOUNTER — Encounter: Payer: Self-pay | Admitting: Family Medicine

## 2013-07-15 ENCOUNTER — Encounter: Payer: Self-pay | Admitting: Family Medicine

## 2013-07-15 ENCOUNTER — Ambulatory Visit (INDEPENDENT_AMBULATORY_CARE_PROVIDER_SITE_OTHER): Payer: 59 | Admitting: Family Medicine

## 2013-07-15 ENCOUNTER — Telehealth: Payer: Self-pay | Admitting: Family Medicine

## 2013-07-15 VITALS — BP 132/78 | HR 93 | Resp 18 | Ht 66.0 in | Wt 232.0 lb

## 2013-07-15 DIAGNOSIS — R109 Unspecified abdominal pain: Secondary | ICD-10-CM

## 2013-07-15 NOTE — Telephone Encounter (Signed)
msg left onn mobile phone Number apologizing for fact that pt left without being seen will also send electronic msg

## 2013-07-21 DIAGNOSIS — R109 Unspecified abdominal pain: Secondary | ICD-10-CM | POA: Insufficient documentation

## 2013-07-21 NOTE — Progress Notes (Signed)
   Subjective:    Patient ID: Shawna Gonzales, female    DOB: 06/22/1981, 33 y.o.   MRN: [id number]  HPI Pt left without being seen unfortunately. I did send a message of apology after theis occured   Review of Systems     Objective:   Physical Exam        Assessment & Plan:

## 2013-10-27 ENCOUNTER — Other Ambulatory Visit (HOSPITAL_COMMUNITY)
Admission: RE | Admit: 2013-10-27 | Discharge: 2013-10-27 | Disposition: A | Discharge status: Home / Self Care | Payer: 59 | Source: Ambulatory Visit | Attending: Emergency Medicine | Admitting: Emergency Medicine

## 2013-10-27 ENCOUNTER — Encounter (HOSPITAL_COMMUNITY): Payer: Self-pay | Admitting: Emergency Medicine

## 2013-10-27 ENCOUNTER — Emergency Department (HOSPITAL_COMMUNITY)
Admission: EM | Admit: 2013-10-27 | Discharge: 2013-10-27 | Disposition: A | Discharge status: Home / Self Care | Payer: 59 | Source: Home / Self Care | Attending: Family Medicine | Admitting: Family Medicine

## 2013-10-27 DIAGNOSIS — R1032 Left lower quadrant pain: Secondary | ICD-10-CM

## 2013-10-27 DIAGNOSIS — B3731 Acute candidiasis of vulva and vagina: Secondary | ICD-10-CM

## 2013-10-27 DIAGNOSIS — N760 Acute vaginitis: Secondary | ICD-10-CM | POA: Insufficient documentation

## 2013-10-27 DIAGNOSIS — B373 Candidiasis of vulva and vagina: Secondary | ICD-10-CM

## 2013-10-27 DIAGNOSIS — Z113 Encounter for screening for infections with a predominantly sexual mode of transmission: Secondary | ICD-10-CM | POA: Insufficient documentation

## 2013-10-27 LAB — POCT URINALYSIS DIP (DEVICE)
BILIRUBIN URINE: NEGATIVE
Glucose, UA: NEGATIVE mg/dL
HGB URINE DIPSTICK: NEGATIVE
Ketones, ur: NEGATIVE mg/dL
Leukocytes, UA: NEGATIVE
NITRITE: NEGATIVE
PH: 8 (ref 5.0–8.0)
PROTEIN: NEGATIVE mg/dL
Specific Gravity, Urine: 1.025 (ref 1.005–1.030)
UROBILINOGEN UA: 0.2 mg/dL (ref 0.0–1.0)

## 2013-10-27 LAB — POCT PREGNANCY, URINE: PREG TEST UR: NEGATIVE

## 2013-10-27 MED ORDER — FLUCONAZOLE 150 MG PO TABS: 150.0000 mg | ORAL_TABLET | Freq: Once | ORAL | Status: AC

## 2013-10-27 MED ORDER — IBUPROFEN 800 MG PO TABS: 800.0000 mg | ORAL_TABLET | Freq: Three times a day (TID) | ORAL | Status: DC

## 2013-10-27 NOTE — ED Notes (Signed)
C/o of pelvic pain x 3 dys. Frequent urination x 3 dys. Left side LBP radiating to LL pelvic pain. Denies: fever;sob, chest pain

## 2013-10-27 NOTE — Discharge Instructions (Signed)
Follow up with your gynecologist.    Abdominal Pain, Women Abdominal (stomach, pelvic, or belly) pain can be caused by many things. It is important to tell your doctor:  The location of the pain.  Does it come and go or is it present all the time?  Are there things that start the pain (eating certain foods, exercise)?  Are there other symptoms associated with the pain (fever, nausea, vomiting, diarrhea)? All of this is helpful to know when trying to find the cause of the pain. CAUSES   Stomach: virus or bacteria infection, or ulcer.  Intestine: appendicitis (inflamed appendix), regional ileitis (Crohn's disease), ulcerative colitis (inflamed colon), irritable bowel syndrome, diverticulitis (inflamed diverticulum of the colon), or cancer of the stomach or intestine.  Gallbladder disease or stones in the gallbladder.  Kidney disease, kidney stones, or infection.  Pancreas infection or cancer.  Fibromyalgia (pain disorder).  Diseases of the female organs:  Uterus: fibroid (non-cancerous) tumors or infection.  Fallopian tubes: infection or tubal pregnancy.  Ovary: cysts or tumors.  Pelvic adhesions (scar tissue).  Endometriosis (uterus lining tissue growing in the pelvis and on the pelvic organs).  Pelvic congestion syndrome (female organs filling up with blood just before the menstrual period).  Pain with the menstrual period.  Pain with ovulation (producing an egg).  Pain with an IUD (intrauterine device, birth control) in the uterus.  Cancer of the female organs.  Functional pain (pain not caused by a disease, may improve without treatment).  Psychological pain.  Depression. DIAGNOSIS  Your doctor will decide the seriousness of your pain by doing an examination.  Blood tests.  X-rays.  Ultrasound.  CT scan (computed tomography, special type of X-ray).  MRI (magnetic resonance imaging).  Cultures, for infection.  Barium enema (dye inserted in the  large intestine, to better view it with X-rays).  Colonoscopy (looking in intestine with a lighted tube).  Laparoscopy (minor surgery, looking in abdomen with a lighted tube).  Major abdominal exploratory surgery (looking in abdomen with a large incision). TREATMENT  The treatment will depend on the cause of the pain.   Many cases can be observed and treated at home.  Over-the-counter medicines recommended by your caregiver.  Prescription medicine.  Antibiotics, for infection.  Birth control pills, for painful periods or for ovulation pain.  Hormone treatment, for endometriosis.  Nerve blocking injections.  Physical therapy.  Antidepressants.  Counseling with a psychologist or psychiatrist.  Minor or major surgery. HOME CARE INSTRUCTIONS   Do not take laxatives, unless directed by your caregiver.  Take over-the-counter pain medicine only if ordered by your caregiver. Do not take aspirin because it can cause an upset stomach or bleeding.  Try a clear liquid diet (broth or water) as ordered by your caregiver. Slowly move to a bland diet, as tolerated, if the pain is related to the stomach or intestine.  Have a thermometer and take your temperature several times a day, and record it.  Bed rest and sleep, if it helps the pain.  Avoid sexual intercourse, if it causes pain.  Avoid stressful situations.  Keep your follow-up appointments and tests, as your caregiver orders.  If the pain does not go away with medicine or surgery, you may try:  Acupuncture.  Relaxation exercises (yoga, meditation).  Group therapy.  Counseling. SEEK MEDICAL CARE IF:   You notice certain foods cause stomach pain.  Your home care treatment is not helping your pain.  You need stronger pain medicine.  You want  your IUD removed.  You feel faint or lightheaded.  You develop nausea and vomiting.  You develop a rash.  You are having side effects or an allergy to your  medicine. SEEK IMMEDIATE MEDICAL CARE IF:   Your pain does not go away or gets worse.  You have a fever.  Your pain is felt only in portions of the abdomen. The right side could possibly be appendicitis. The left lower portion of the abdomen could be colitis or diverticulitis.  You are passing blood in your stools (bright red or black tarry stools, with or without vomiting).  You have blood in your urine.  You develop chills, with or without a fever.  You pass out. MAKE SURE YOU:   Understand these instructions.  Will watch your condition.  Will get help right away if you are not doing well or get worse. Document Released: 04/17/2007 Document Revised: 09/12/2011 Document Reviewed: 05/07/2009 [facility] Patient Information 2014 [place], [place].

## 2013-10-27 NOTE — ED Provider Notes (Signed)
CSN: [id number]     Arrival date & time 10/27/13  1336 History   First MD Initiated Contact with Patient 10/27/13 1540     No chief complaint on file.  (Consider location/radiation/quality/duration/timing/severity/associated sxs/prior Treatment) HPI Comments: Feels similar to abd pain pt had in 2013 for which she went to ER. 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 Recently went on depo provera.   Patient is a 33 y.o. female presenting with abdominal pain. The history is provided by the patient.  Abdominal Pain Pain location:  LLQ Pain quality: aching   Pain radiates to:  Back Pain severity:  Moderate Onset quality:  Gradual Duration:  3 days Timing:  Constant Progression:  Unchanged Chronicity:  New Relieved by:  Nothing Worsened by:  Palpation Ineffective treatments:  Acetaminophen Associated symptoms: no chills, no dysuria, no fever, no nausea, no vaginal bleeding, no vaginal discharge and no vomiting   Risk factors: not pregnant     Past Medical History  Diagnosis Date  . Macromastia   . Obesity   . Contraceptive management   . High cholesterol   . GERD (gastroesophageal reflux disease)    Past Surgical History  Procedure Laterality Date  . Wisdom tooth extraction    . Breast surgery     Family History  Problem Relation Age of Onset  . Heart attack    . Hyperlipidemia Father   . Heart attack Maternal Grandfather   . Hypertension Paternal Grandfather    History  Substance Use Topics  . Smoking status: Never Smoker   . Smokeless tobacco: Not on file  . Alcohol Use: No   OB History   Grav Para Term Preterm Abortions TAB SAB Ect Mult Living                 Review of Systems  Constitutional: Negative for fever and chills.  Gastrointestinal: Positive for abdominal pain. Negative for nausea and vomiting.  Genitourinary: Negative for dysuria, vaginal bleeding and vaginal discharge.    Allergies  Review of patient's allergies  indicates no known allergies.  Home Medications   Prior to Admission medications   Medication Sig Start Date End Date Taking? Authorizing Provider  fluticasone (FLONASE) 50 MCG/ACT nasal spray Place 2 sprays into the nose daily. 03/06/13 03/06/14 Yes Kerri PerchesMargaret E Simpson, MD  Multiple Vitamin (MULITIVITAMIN WITH MINERALS) TABS Take 1 tablet by mouth daily.   Yes Historical Provider, MD  pantoprazole (PROTONIX) 20 MG tablet Take 1 tablet (20 mg total) by mouth daily. 12/07/12 12/07/13 Yes Kerri PerchesMargaret E Simpson, MD  rosuvastatin (CRESTOR) 10 MG tablet Take 1 tablet (10 mg total) by mouth at bedtime. 07/08/13 08/18/15 Yes Kerri PerchesMargaret E Simpson, MD  fluconazole (DIFLUCAN) 150 MG tablet Take 1 tablet (150 mg total) by mouth once. May repeat x1 if needed in 3-7 days 10/27/13 11/03/13  Cathlyn ParsonsAngela M Sharryn Belding, NP  ibuprofen (ADVIL,MOTRIN) 800 MG tablet Take 1 tablet (800 mg total) by mouth 3 (three) times daily. 10/27/13   Cathlyn ParsonsAngela M Sanyiah Kanzler, NP  meloxicam (MOBIC) 15 MG tablet Take 1 tablet (15 mg total) by mouth daily. 01/28/13   Rodolph BongEvan S Corey, MD  naproxen (NAPROSYN) 375 MG tablet Take 1 tablet (375 mg total) by mouth 2 (two) times daily with a meal. 03/06/13 03/06/14  Kerri PerchesMargaret E Simpson, MD  TRI-SPRINTEC 0.18/0.215/0.25 MG-35 MCG tablet TAKE 1 TABLET BY MOUTH DAILY 07/16/12   Kerri PerchesMargaret E Simpson, MD   BP 126/86  Pulse 81  Temp(Src) 98.3 F (  36.8 C) (Oral)  Resp 18  SpO2 97% Physical Exam  Constitutional: She appears well-developed and well-nourished. No distress.  Cardiovascular: Normal rate and regular rhythm.   Pulmonary/Chest: Effort normal and breath sounds normal.  Abdominal: Normal appearance and bowel sounds are normal. She exhibits no distension. There is tenderness in the suprapubic area and left lower quadrant. There is no rigidity, no rebound, no guarding and no CVA tenderness.  Genitourinary: There is no rash, tenderness or lesion on the right labia. There is no rash, tenderness or lesion on the left labia. Uterus is tender.  Cervix exhibits no motion tenderness, no discharge and no friability. Right adnexum displays no mass and no tenderness. Left adnexum displays tenderness. No erythema, tenderness or bleeding around the vagina. No foreign body around the vagina. No signs of injury around the vagina. Vaginal discharge found.    ED Course  Procedures (including critical care time) Labs Review Labs Reviewed  POCT URINALYSIS DIP (DEVICE)  POCT PREGNANCY, URINE  CERVICOVAGINAL ANCILLARY ONLY    Imaging Review No results found.   MDM   1. LLQ abdominal pain   2. Candidiasis, vagina   Pt stable, to f/u with gyn. Rx ibuprofen 800mg  TID prn pain #21 and diflucan 150mg  po x1, may repeat in 3-7 days, #2.      Cathlyn ParsonsAngela M Palin Tristan, NP 10/27/13 [phone]

## 2013-10-28 LAB — CERVICOVAGINAL ANCILLARY ONLY
CHLAMYDIA, DNA PROBE: NEGATIVE
Neisseria Gonorrhea: NEGATIVE
WET PREP (BD AFFIRM): NEGATIVE
Wet Prep (BD Affirm): NEGATIVE
Wet Prep (BD Affirm): NEGATIVE

## 2013-10-30 NOTE — ED Provider Notes (Signed)
Medical screening examination/treatment/procedure(s) were performed by a resident physician or non-physician practitioner and as the supervising physician I was immediately available for consultation/collaboration.  Crystalynn Mcinerney, MD    Laiah Pouncey S Xochitl Egle, MD 10/30/13 0801 

## 2013-11-08 ENCOUNTER — Ambulatory Visit: Payer: 59 | Admitting: Internal Medicine

## 2013-11-12 ENCOUNTER — Telehealth: Payer: Self-pay | Admitting: Family Medicine

## 2013-11-12 NOTE — Telephone Encounter (Signed)
Lab order faxed over.

## 2013-11-13 ENCOUNTER — Telehealth: Payer: Self-pay | Admitting: Family Medicine

## 2013-11-13 NOTE — Telephone Encounter (Signed)
[facility] to [facility]

## 2014-01-02 ENCOUNTER — Ambulatory Visit (INDEPENDENT_AMBULATORY_CARE_PROVIDER_SITE_OTHER): Payer: 59 | Admitting: Internal Medicine

## 2014-01-02 ENCOUNTER — Encounter: Payer: Self-pay | Admitting: Internal Medicine

## 2014-01-02 VITALS — BP 122/78 | HR 90 | Ht 65.5 in | Wt 243.1 lb

## 2014-01-02 DIAGNOSIS — E785 Hyperlipidemia, unspecified: Secondary | ICD-10-CM

## 2014-01-02 LAB — HEPATIC FUNCTION PANEL
ALT: 22 U/L (ref 0–35)
AST: 15 U/L (ref 0–37)
Albumin: 3.5 g/dL (ref 3.5–5.2)
Alkaline Phosphatase: 73 U/L (ref 39–117)
BILIRUBIN DIRECT: 0.1 mg/dL (ref 0.0–0.3)
Indirect Bilirubin: 0.4 mg/dL (ref 0.2–1.2)
Total Bilirubin: 0.5 mg/dL (ref 0.2–1.2)
Total Protein: 6.2 g/dL (ref 6.0–8.3)

## 2014-01-02 LAB — HEMOGLOBIN A1C
Hgb A1c MFr Bld: 5.5 % (ref <5.7)
Mean Plasma Glucose: 111 mg/dL (ref <117)

## 2014-01-02 LAB — LIPID PANEL
CHOL/HDL RATIO: 3.9 ratio
Cholesterol: 141 mg/dL (ref 0–200)
HDL: 36 mg/dL — AB (ref >39)
LDL CALC: 75 mg/dL (ref 0–99)
TRIGLYCERIDES: 151 mg/dL — AB (ref <150)
VLDL: 30 mg/dL (ref 0–40)

## 2014-01-02 NOTE — Progress Notes (Signed)
HPI Patinet is a 33 yo who is referred for risk status evaluation. Patinet has no known history of CAD  But mother with  CAD  Died of MI at 32  PGF died at 32.   Followed in OB  History of recurrent miscarriages.    Occasional heart flutter  Seconds Works night  Ankles swell occaionally Denies CP  Breathing is OK Has stopped statin  Lipids drawn yesterday  No Known Allergies  Current Outpatient Prescriptions  Medication Sig Dispense Refill  . fluticasone (FLONASE) 50 MCG/ACT nasal spray Place 2 sprays into the nose daily.  16 g  2  . ibuprofen (ADVIL,MOTRIN) 800 MG tablet Take 1 tablet (800 mg total) by mouth 3 (three) times daily.  21 tablet  0  . MedroxyPROGESTERone Acetate (DEPO-PROVERA IM) Inject 15 mg into the muscle every 3 (three) months.      . Multiple Minerals-Vitamins (CALCIUM & VIT D3 BONE HEALTH PO) Take 1 tablet by mouth 3 (three) times daily.       No current facility-administered medications for this visit.    Past Medical History  Diagnosis Date  . Macromastia   . Obesity   . Contraceptive management   . High cholesterol   . GERD (gastroesophageal reflux disease)     Past Surgical History  Procedure Laterality Date  . Wisdom tooth extraction    . Breast surgery      Family History  Problem Relation Age of Onset  . Heart attack    . Hyperlipidemia Father   . Heart attack Maternal Grandfather   . Hypertension Paternal Grandfather     History   Social History  . Marital Status: Single    Spouse Name: N/A    Number of Children: N/A  . Years of Education: N/A   Occupational History  . physical therapist at [employer]     Social History Main Topics  . Smoking status: Never Smoker   . Smokeless tobacco: Not on file  . Alcohol Use: No  . Drug Use: No  . Sexual Activity: Not Currently   Other Topics Concern  . Not on file   Social History Narrative  . No narrative on file    Review of Systems:  All systems reviewed.  They are negative to  the above problem except as previously stated.  Vital Signs: BP 122/78  Pulse 90  Ht 5' 5.5" (1.664 m)  Wt 243 lb 1.9 oz (110.279 kg)  BMI 39.83 kg/m2  Physical Exam Patient is a morbidly obese 33 yo in NAD HEENT:  Normocephalic, atraumatic. EOMI, PERRLA.  Neck: JVP is normal.  No bruits.  Lungs: clear to auscultation. No rales no wheezes.  Heart: Regular rate and rhythm. Normal S1, S2. No S3.   No significant murmurs. PMI not displaced.  Abdomen:  Supple, nontender. Normal bowel sounds. No masses. No hepatomegaly.  Extremities:   Good distal pulses throughout. No lower extremity edema.  Musculoskeletal :moving all extremities.  Neuro:   alert and oriented x3.  CN II-XII grossly intact.  EKG  SR 84  Normal conduction intervals.  Assessment and Plan:  1.  Cardiac risk assessment. I am not convinced the patinet has any signif CAD   She does have risks however.  Obese.  Hx of dyslipidemia I would not plan any further cardiac testing other than getting lipids that were drawn Otherwise I encouraged her to lose wt.    F/U prn

## 2014-01-02 NOTE — Patient Instructions (Signed)
Your physician recommends that you continue on your current medications as directed. Please refer to the Current Medication list given to you today. Your physician recommends that you schedule a follow-up appointment as needed with Dr. Ross.   

## 2014-01-06 ENCOUNTER — Ambulatory Visit (INDEPENDENT_AMBULATORY_CARE_PROVIDER_SITE_OTHER): Payer: 59 | Admitting: Family Medicine

## 2014-01-06 ENCOUNTER — Encounter: Payer: Self-pay | Admitting: Family Medicine

## 2014-01-06 VITALS — BP 118/74 | HR 84 | Resp 18 | Ht 66.0 in | Wt 242.0 lb

## 2014-01-06 DIAGNOSIS — E669 Obesity, unspecified: Secondary | ICD-10-CM

## 2014-01-06 DIAGNOSIS — G4733 Obstructive sleep apnea (adult) (pediatric): Secondary | ICD-10-CM | POA: Insufficient documentation

## 2014-01-06 DIAGNOSIS — E785 Hyperlipidemia, unspecified: Secondary | ICD-10-CM

## 2014-01-06 DIAGNOSIS — G4730 Sleep apnea, unspecified: Secondary | ICD-10-CM

## 2014-01-06 DIAGNOSIS — G479 Sleep disorder, unspecified: Secondary | ICD-10-CM

## 2014-01-06 DIAGNOSIS — G478 Other sleep disorders: Secondary | ICD-10-CM

## 2014-01-06 MED ORDER — LORCASERIN HCL 10 MG PO TABS: 1.0000 | ORAL_TABLET | Freq: Two times a day (BID) | ORAL | Status: DC

## 2014-01-06 NOTE — Patient Instructions (Addendum)
F/u in 3 month, call if you  Need me before   Weight loss goal of 5 pounds per month  Pls change eating habits as we discussed, and enjoy daily physical activity, the results are great  BELIEVE you are able to achieve your goal  You will be referred for eval for sleep disorder also to ENT re nasal obstruction  Pls try using flonase again but correcly  Contact Dr Tenny Crawross re her recs for lipid therapy, and follow her directions, my thought is that you can deal with this without medication  You are referred for evaluation for sleep disorder

## 2014-01-09 ENCOUNTER — Encounter: Payer: Self-pay | Admitting: Internal Medicine

## 2014-01-27 ENCOUNTER — Encounter: Payer: Self-pay | Admitting: Family Medicine

## 2014-01-29 ENCOUNTER — Other Ambulatory Visit: Payer: Self-pay | Admitting: Family Medicine

## 2014-01-29 MED ORDER — PHENTERMINE HCL 37.5 MG PO TABS: 37.5000 mg | ORAL_TABLET | Freq: Every day | ORAL | Status: DC

## 2014-02-10 ENCOUNTER — Institutional Professional Consult (permissible substitution): Payer: 59 | Admitting: Pulmonary Disease

## 2014-03-04 ENCOUNTER — Encounter: Payer: Self-pay | Admitting: Family Medicine

## 2014-03-12 ENCOUNTER — Emergency Department (HOSPITAL_COMMUNITY)
Admission: EM | Admit: 2014-03-12 | Discharge: 2014-03-12 | Disposition: A | Discharge status: Home / Self Care | Payer: 59 | Source: Home / Self Care | Attending: Family Medicine | Admitting: Family Medicine

## 2014-03-12 ENCOUNTER — Encounter (HOSPITAL_COMMUNITY): Payer: Self-pay | Admitting: Emergency Medicine

## 2014-03-12 DIAGNOSIS — H109 Unspecified conjunctivitis: Secondary | ICD-10-CM

## 2014-03-12 MED ORDER — POLYMYXIN B-TRIMETHOPRIM 10000-0.1 UNIT/ML-% OP SOLN: 1.0000 [drp] | OPHTHALMIC | Status: DC

## 2014-03-12 MED ORDER — OLOPATADINE HCL 0.2 % OP SOLN: 1.0000 [drp] | Freq: Every day | OPHTHALMIC | Status: DC

## 2014-03-12 NOTE — Discharge Instructions (Signed)
You likely are experiencing the beginnings of a bacterial conjunctivitis. Please continue the zyrtec and start the pataday If you do not improve start the polytrim   Conjunctivitis Conjunctivitis is commonly called "pink eye." Conjunctivitis can be caused by bacterial or viral infection, allergies, or injuries. There is usually redness of the lining of the eye, itching, discomfort, and sometimes discharge. There may be deposits of matter along the eyelids. A viral infection usually causes a watery discharge, while a bacterial infection causes a yellowish, thick discharge. Pink eye is very contagious and spreads by direct contact. You may be given antibiotic eyedrops as part of your treatment. Before using your eye medicine, remove all drainage from the eye by washing gently with warm water and cotton balls. Continue to use the medication until you have awakened 2 mornings in a row without discharge from the eye. Do not rub your eye. This increases the irritation and helps spread infection. Use separate towels from other household members. Wash your hands with soap and water before and after touching your eyes. Use cold compresses to reduce pain and sunglasses to relieve irritation from light. Do not wear contact lenses or wear eye makeup until the infection is gone. SEEK MEDICAL CARE IF:   Your symptoms are not better after 3 days of treatment.  You have increased pain or trouble seeing.  The outer eyelids become very red or swollen. Document Released: 07/28/2004 Document Revised: 09/12/2011 Document Reviewed: 06/20/2005 [facility] Patient Information 2015 [place], [place]. This information is not intended to replace advice given to you by your health care provider. Make sure you discuss any questions you have with your health care provider.

## 2014-03-12 NOTE — ED Provider Notes (Signed)
CSN: [id number]     Arrival date & time 03/12/14  1636 History   First MD Initiated Contact with Patient 03/12/14 1716     Chief Complaint  Patient presents with  . Eye Pain   (Consider location/radiation/quality/duration/timing/severity/associated sxs/prior Treatment) HPI  L eye irritation: itchy. Crusted over in the mornings and throughought the day. Started SUnday. H/o allergies. Denies any recent cold symptoms. Tried zyrtec w/o benefit. Denies any change in vision and mild pain. Denies any eyelid irritation. No sick contacts.    Past Medical History  Diagnosis Date  . Macromastia   . Obesity   . Contraceptive management   . High cholesterol   . GERD (gastroesophageal reflux disease)    Past Surgical History  Procedure Laterality Date  . Wisdom tooth extraction    . Breast surgery     Family History  Problem Relation Age of Onset  . Heart attack    . Hyperlipidemia Father   . Heart attack Maternal Grandfather   . Hypertension Paternal Grandfather    History  Substance Use Topics  . Smoking status: Never Smoker   . Smokeless tobacco: Not on file  . Alcohol Use: No   OB History   Grav Para Term Preterm Abortions TAB SAB Ect Mult Living                 Review of Systems Per HPI with all other pertinent systems negative.    Allergies  Review of patient's allergies indicates no known allergies.  Home Medications   Prior to Admission medications   Medication Sig Start Date End Date Taking? Authorizing Provider  ibuprofen (ADVIL,MOTRIN) 800 MG tablet Take 1 tablet (800 mg total) by mouth 3 (three) times daily. 10/27/13   Cathlyn Parsons, NP  Lorcaserin HCl 10 MG TABS Take 1 tablet by mouth 2 (two) times daily. 01/06/14   Kerri Perches, MD  MedroxyPROGESTERone Acetate (DEPO-PROVERA IM) Inject 15 mg into the muscle every 3 (three) months.    Historical Provider, MD  Multiple Minerals-Vitamins (CALCIUM & VIT D3 BONE HEALTH PO) Take 1 tablet by mouth 3 (three) times  daily.    Historical Provider, MD  Olopatadine HCl 0.2 % SOLN Apply 1-2 drops to eye daily. 03/12/14   Ozella Rocks, MD  phentermine (ADIPEX-P) 37.5 MG tablet Take 1 tablet (37.5 mg total) by mouth daily before breakfast. 01/29/14   Kerri Perches, MD  trimethoprim-polymyxin b (POLYTRIM) ophthalmic solution Place 1-2 drops into both eyes every 4 (four) hours. 03/12/14   Ozella Rocks, MD   BP 138/78  Pulse 83  Temp(Src) 98.2 F (36.8 C) (Oral)  Resp 16  SpO2 98%  LMP 02/17/2014 Physical Exam  Constitutional: She is oriented to person, place, and time. She appears well-developed and well-nourished. No distress.  HENT:  Head: Normocephalic and atraumatic.  Eyes: EOM are normal. Pupils are equal, round, and reactive to light. Left eye exhibits discharge.  L and R conjuctiva injected w/ L>R.  Mild crusting medially on L  Neck: Normal range of motion. Neck supple.  Cardiovascular: Normal rate, normal heart sounds and intact distal pulses.  Exam reveals no gallop.   No murmur heard. Pulmonary/Chest: Effort normal and breath sounds normal. No respiratory distress.  Abdominal: Soft. Bowel sounds are normal.  Musculoskeletal: Normal range of motion. She exhibits no edema and no tenderness.  Lymphadenopathy:    She has no cervical adenopathy.  Neurological: She is alert and oriented to person, place, and time.  She exhibits normal muscle tone.  Skin: Skin is warm and dry. She is not diaphoretic.  Psychiatric: She has a normal mood and affect. Her behavior is normal. Thought content normal.    ED Course  Procedures (including critical care time) Labs Review Labs Reviewed - No data to display  Imaging Review No results found.   MDM   1. Bilateral conjunctivitis    Due to onset of irritation and presence of possible discharge will Rx Polytrim.  Pataday prn relief Precautions given and all questions answered  Shelly Flatten, MD Family Medicine 03/12/2014, 5:31 PM      Ozella Rocks, MD 03/12/14 [phone]

## 2014-03-12 NOTE — ED Notes (Signed)
C/o left eye redness, drainage, watery and irritation onset sunday Alert, no signs of acute distress.

## 2014-03-19 ENCOUNTER — Encounter: Payer: Self-pay | Admitting: Pulmonary Disease

## 2014-03-19 ENCOUNTER — Ambulatory Visit (INDEPENDENT_AMBULATORY_CARE_PROVIDER_SITE_OTHER): Payer: 59 | Admitting: Pulmonary Disease

## 2014-03-19 VITALS — BP 118/62 | HR 101 | Temp 98.5°F | Ht 65.5 in | Wt 248.0 lb

## 2014-03-19 DIAGNOSIS — G4733 Obstructive sleep apnea (adult) (pediatric): Secondary | ICD-10-CM

## 2014-03-19 NOTE — Assessment & Plan Note (Signed)
The patient's history is very suggestive of clinically significant obstructive sleep apnea. I have had a long discussion with her about sleep-disordered breathing, including its impact to her quality of life and cardiovascular health. She needs to have a sleep study for diagnosis, and she is an excellent candidate for home sleep testing.  The patient is agreeable to this approach.

## 2014-03-19 NOTE — Patient Instructions (Signed)
Will schedule for home sleep testing, and will arrange followup once the results are available.  Work on weight reduction.  

## 2014-03-19 NOTE — Progress Notes (Signed)
Subjective:    Patient ID: Shawna Gonzales, female    DOB: [birth year], 33 y.o.   MRN: [id number]  HPI The patient is a 33 year old female who I've been asked to see for possible obstructive sleep apnea. She has been noted to have loud snoring, but does not have a usual bed partner who can comment on an abnormal breathing pattern during sleep. She denies choking arousals, but has fairly frequently snoring arousals. She has frequent awakenings at night, and often is associated with profuse sweating. She is not rested in the mornings upon arising, and states that she does have sleep pressure during the day with periods of inactivity. She also can get sleepy at night while trying to watch television or movies. She usually does not have issues with sleepiness while driving, but this can occur after working a 12 hour shift and driving home. The patient states that her weight is up over 15 pounds over the last 2 years, and her Epworth score today is abnormal at 12.   Sleep Questionnaire What time do you typically go to bed?( Between what hours) 10p-12a 10p-12a at 1512 on 03/19/14 by Tommie Sams, CMA How long does it take you to fall asleep? b/w 1-2hrs b/w 1-2hrs at 1512 on 03/19/14 by Tommie Sams, CMA How many times during the night do you wake up? 2 2 at 1512 on 03/19/14 by Tommie Sams, CMA What time do you get out of bed to start your day? 0700 0700 at 1512 on 03/19/14 by Tommie Sams, CMA Do you drive or operate heavy machinery in your occupation? No No at 1512 on 03/19/14 by Tommie Sams, CMA How much has your weight changed (up or down) over the past two years? (In pounds) 15 lb (6.804 kg) 15 lb (6.804 kg) at 1512 on 03/19/14 by Tommie Sams, CMA Have you ever had a sleep study before? No No at 1512 on 03/19/14 by Tommie Sams, CMA Do you currently use CPAP? No No at 1512 on 03/19/14 by Tommie Sams, CMA Do you wear oxygen at any time? No    Review of Systems    Constitutional: Negative for fever and unexpected weight change.  HENT: Positive for congestion and sneezing. Negative for dental problem, ear pain, nosebleeds, postnasal drip, rhinorrhea, sinus pressure, sore throat and trouble swallowing.   Eyes: Negative for redness and itching.  Respiratory: Negative for cough, chest tightness, shortness of breath and wheezing.   Cardiovascular: Positive for leg swelling. Negative for palpitations.  Gastrointestinal: Negative for nausea and vomiting.  Genitourinary: Negative for dysuria.  Musculoskeletal: Positive for arthralgias. Negative for joint swelling.  Skin: Negative for rash.  Neurological: Positive for headaches.  Hematological: Does not bruise/bleed easily.  Psychiatric/Behavioral: Negative for dysphoric mood. The patient is not nervous/anxious.        Objective:   Physical Exam Constitutional:  Obese female, no acute distress  HENT:  Nares patent without discharge, but large turbinates with edema and septal deviation to the left with near occlusion  Oropharynx without exudate, palate and uvula are normal  Eyes:  Perrla, eomi, no scleral icterus  Neck:  No JVD, no TMG  Cardiovascular:  Normal rate, regular rhythm, no rubs or gallops.  No murmurs        Intact distal pulses  Pulmonary :  Normal breath sounds, no stridor or respiratory distress   No rales, rhonchi, or wheezing  Abdominal:  Soft, nondistended, bowel sounds present.  No tenderness noted.   Musculoskeletal:  No lower extremity edema noted.  Lymph Nodes:  No cervical lymphadenopathy noted  Skin:  No cyanosis noted  Neurologic:  Alert, appropriate, moves all 4 extremities without obvious deficit.         Assessment & Plan:

## 2014-04-03 ENCOUNTER — Ambulatory Visit (INDEPENDENT_AMBULATORY_CARE_PROVIDER_SITE_OTHER): Payer: 59 | Admitting: Family Medicine

## 2014-04-03 ENCOUNTER — Telehealth: Payer: Self-pay | Admitting: Family Medicine

## 2014-04-03 ENCOUNTER — Encounter: Payer: Self-pay | Admitting: Family Medicine

## 2014-04-03 VITALS — BP 120/82 | HR 96 | Resp 16 | Ht 65.5 in | Wt 246.4 lb

## 2014-04-03 DIAGNOSIS — G43909 Migraine, unspecified, not intractable, without status migrainosus: Secondary | ICD-10-CM | POA: Insufficient documentation

## 2014-04-03 DIAGNOSIS — R7302 Impaired glucose tolerance (oral): Secondary | ICD-10-CM

## 2014-04-03 DIAGNOSIS — M25472 Effusion, left ankle: Secondary | ICD-10-CM

## 2014-04-03 DIAGNOSIS — E785 Hyperlipidemia, unspecified: Secondary | ICD-10-CM

## 2014-04-03 DIAGNOSIS — Z0289 Encounter for other administrative examinations: Secondary | ICD-10-CM

## 2014-04-03 DIAGNOSIS — R1013 Epigastric pain: Secondary | ICD-10-CM

## 2014-04-03 DIAGNOSIS — E669 Obesity, unspecified: Secondary | ICD-10-CM

## 2014-04-03 DIAGNOSIS — Z020 Encounter for examination for admission to educational institution: Secondary | ICD-10-CM

## 2014-04-03 DIAGNOSIS — G43009 Migraine without aura, not intractable, without status migrainosus: Secondary | ICD-10-CM

## 2014-04-03 DIAGNOSIS — Z8249 Family history of ischemic heart disease and other diseases of the circulatory system: Secondary | ICD-10-CM

## 2014-04-03 DIAGNOSIS — K219 Gastro-esophageal reflux disease without esophagitis: Secondary | ICD-10-CM | POA: Insufficient documentation

## 2014-04-03 DIAGNOSIS — G4730 Sleep apnea, unspecified: Secondary | ICD-10-CM | POA: Insufficient documentation

## 2014-04-03 DIAGNOSIS — R140 Abdominal distension (gaseous): Secondary | ICD-10-CM

## 2014-04-03 MED ORDER — SUMATRIPTAN SUCCINATE 100 MG PO TABS: ORAL_TABLET | ORAL | Status: DC

## 2014-04-03 MED ORDER — PANTOPRAZOLE SODIUM 20 MG PO TBEC: 20.0000 mg | DELAYED_RELEASE_TABLET | Freq: Every day | ORAL | Status: DC

## 2014-04-03 NOTE — Assessment & Plan Note (Addendum)
low hDL, increased risk of CAD due to family history, commitment to regular exercise is stressed Hyperlipidemia:Low fat diet discussed and encouraged.   Guoidline as far as statin management to be determined by cardiology

## 2014-04-03 NOTE — Assessment & Plan Note (Signed)
Patient educated about the importance of limiting  Carbohydrate intake , the need to commit to daily physical activity for a minimum of 30 minutes , and to commit weight loss. The fact that changes in all these areas will reduce or eliminate all together the development of diabetes is stressed.   Updated lab needed 

## 2014-04-03 NOTE — Progress Notes (Signed)
Subjective:    Patient ID: Shawna ConnersVictoria A Maners, female    DOB: 12/29/1980, 33 y.o.   MRN: [id number]  HPI The PT is here for follow up and re-evaluation of chronic medical conditions, medication management and review of any available recent lab and radiology data.  Preventive health is updated, specifically  Cancer screening and Immunization.   Has seen pulmonary specialist and needs to have sleep study. The PT denies any adverse reactions to current medications since the last visit.  Left ankle swelling since August,continually, has 2 pics top show, today only, not swollen H/o arthritis left knee. Headache pounding right temporal, has migraine history, typical migraine and nausea, relieved with emesis since 9/22 to 9/26.First migraine in years Ongoing nausea , bloating and dyspepsia Still has not been consistent with weight loss efforts ,   No regular  physical activity ,  And diet needs to improve.       Review of Systems See HPI Denies recent fever or chills. Denies sinus pressure, nasal congestion, ear pain or sore throat. Denies chest congestion, productive cough or wheezing. Denies chest pains, palpitations and leg swelling  Denies dysuria, frequency, hesitancy or incontinence. Left ankle  swelling for past 6 weeks, no trauma known , has known arthritis in  Left knee Denies headaches, seizures, numbness, or tingling. Denies depression, does have increased  anxiety and mild  Insomnia due to stress associated with school Denies skin break down or rash.        Objective:   Physical Exam BP 120/82  Pulse 96  Resp 16  Ht 5' 5.5" (1.664 m)  Wt 246 lb 6.4 oz (111.766 kg)  BMI 40.36 kg/m2  SpO2 96%  LMP 02/17/2014 Patient alert and oriented and in no cardiopulmonary distress.  HEENT: No facial asymmetry, EOMI,   oropharynx pink and moist.  Neck supple no JVD, no mass.  Chest: Clear to auscultation bilaterally.  CVS: S1, S2 no murmurs, no S3.Regular rate.  ABD:  Soft non tender.   Ext: No edema  MS: Adequate ROM spine, shoulders, hips and knees.Adequate ROM left ankle , left ankle has no swelling or tenderness.no bruising or redness, and has full RONM, there is slight deformity of left ankle  Skin: Intact, no ulcerations or rash noted.  Psych: Good eye contact, normal affect. Memory intact not anxious or depressed appearing.  CNS: CN 2-12 intact, power,  normal throughout.no focal deficits noted.        Assessment & Plan:  Postprandial abdominal bloating Chronic history, but worse in past 2 to 3 months, re evaluate foir gall bladder disease Check H pylori status  Left ankle swelling 6 to 8 week h/o swelling , unilateral, no known trauma, xray to be obtained, clinical impression is of mild arthritis  Impaired glucose tolerance Patient educated about the importance of limiting  Carbohydrate intake , the need to commit to daily physical activity for a minimum of 30 minutes , and to commit weight loss. The fact that changes in all these areas will reduce or eliminate all together the development of diabetes is stressed.   Updated lab needed   Migraine Recent recurrence of headache x 4 days, currently under new increased stress as she has started school full time. Has responded well in the past to imitrex, same prescribed for as needed use. Improved stress management discussed and encouraged to include commitment to regular physical activity  GERD (gastroesophageal reflux disease) Pt ed reviewed to reduce symptoms, resume protonix as needed  Obesity Unchanged Patient re-educated about  the importance of commitment to a  minimum of 150 minutes of exercise per week. The importance of healthy food choices with portion control discussed. Encouraged to start a food diary, count calories and to consider  joining a support group. Sample diet sheets offered. Goals set by the patient for the next several months.     Encounter for completion  of form with patient Recently started course at [facility] for medical assisting, form completed as much as able as far as medical history requested. Outstanding lab data requested  For form completion  Dyslipidemia Hyperlipidemia:Low fat diet discussed and encouraged.  Updated lab needed

## 2014-04-03 NOTE — Assessment & Plan Note (Signed)
Deteriorated. Patient re-educated about  the importance of commitment to a  minimum of 150 minutes of exercise per week. The importance of healthy food choices with portion control discussed. Encouraged to start a food diary, count calories and to consider  joining a support group. Sample diet sheets offered. Goals set by the patient for the next several months.    

## 2014-04-03 NOTE — Telephone Encounter (Signed)
Patient is aware 

## 2014-04-03 NOTE — Assessment & Plan Note (Signed)
Refer  For evaluation [facility] pulmonary for sleep apnea Pt encouraged to work on weight loss and regular exercise to improve health

## 2014-04-03 NOTE — Assessment & Plan Note (Signed)
6 to 8 week h/o swelling , unilateral, no known trauma, xray to be obtained, clinical impression is of mild arthritis

## 2014-04-03 NOTE — Assessment & Plan Note (Signed)
Recent recurrence of headache x 4 days, currently under new increased stress as she has started school full time. Has responded well in the past to imitrex, same prescribed for as needed use. Improved stress management discussed and encouraged to include commitment to regular physical activity

## 2014-04-03 NOTE — Assessment & Plan Note (Signed)
Chronic history, but worse in past 2 to 3 months, re evaluate foir gall bladder disease Check H pylori status

## 2014-04-03 NOTE — Assessment & Plan Note (Signed)
Unchanged. Patient re-educated about  the importance of commitment to a  minimum of 150 minutes of exercise per week. The importance of healthy food choices with portion control discussed. Encouraged to start a food diary, count calories and to consider  joining a support group. Sample diet sheets offered. Goals set by the patient for the next several months.    

## 2014-04-03 NOTE — Assessment & Plan Note (Signed)
Pt ed reviewed to reduce symptoms, resume protonix as needed

## 2014-04-03 NOTE — Progress Notes (Signed)
   Subjective:    Patient ID: Shawna Gonzales, female    DOB: 07/08/1980, 33 y.o.   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 Denies sinus pressure, nasal congestion, ear pain or sore throat. Denies chest congestion, productive cough or wheezing. Denies chest pains, palpitations and leg swelling Denies abdominal pain, nausea, vomiting,diarrhea or constipation.   Denies dysuria, frequency, hesitancy or incontinence. Denies joint pain, swelling and limitation in mobility. Denies headaches, seizures, numbness, or tingling. Denies depression, anxiety or insomnia. Denies skin break down or rash.        Objective:   Physical Exam BP 118/74  Pulse 84  Resp 18  Ht 5\' 6"  (1.676 m)  Wt 242 lb (109.77 kg)  BMI 39.08 kg/m2  SpO2 98% Patient alert and oriented and in no cardiopulmonary distress.  HEENT: No facial asymmetry, EOMI,   oropharynx pink and moist.  Neck supple no JVD, no mass.  Chest: Clear to auscultation bilaterally.  CVS: S1, S2 no murmurs, no S3.Regular rate.  ABD: Soft non tender.   Ext: No edema  MS: Adequate ROM spine, shoulders, hips and knees.  Skin: Intact, no ulcerations or rash noted.  Psych: Good eye contact, normal affect. Memory intact not anxious or depressed appearing.  CNS: CN 2-12 intact, power,  normal throughout.no focal deficits noted.        Assessment & Plan:  Sleep disorder breathing Refer  For evaluation [facility] pulmonary for sleep apnea Pt encouraged to work on weight loss  and regular exercise to improve health  Obesity Deteriorated. Patient re-educated about  the importance of commitment to a  minimum of 150 minutes of exercise per week. The importance of healthy food choices with portion control discussed. Encouraged to start a food diary, count calories and to consider  joining a support group. Sample diet sheets offered. Goals set by the patient for the next several months.     Dyslipidemia low hDL, increased risk of CAD due to family history, commitment to regular exercise is stressed Hyperlipidemia:Low fat diet discussed and encouraged.   Guoidline as far as statin management to be determined by cardiology

## 2014-04-03 NOTE — Assessment & Plan Note (Signed)
Recently started course at [facility] [employer] for medical assisting, form completed as much as able as far as medical history requested. Outstanding lab data requested  For form completion

## 2014-04-03 NOTE — Patient Instructions (Addendum)
F/u in 4 month, call if you need me before  Please work on stress management with commitment to regular physical activity, and specific times for mental breaks/relaxation  ALL the  Best with school!   You are referred for US of abdomen to look for gallstones, also xray of left ankle [facility]([place])  New meds protonix and imitrex  Fasting complete lipid panel , chem 7, tSH, H pylori, CBC

## 2014-04-03 NOTE — Assessment & Plan Note (Signed)
Hyperlipidemia:Low fat diet discussed and encouraged.  Updated lab needed 

## 2014-04-08 ENCOUNTER — Ambulatory Visit (HOSPITAL_COMMUNITY): Payer: 59

## 2014-04-08 ENCOUNTER — Other Ambulatory Visit: Payer: Self-pay | Admitting: Family Medicine

## 2014-04-08 LAB — URINALYSIS
Bilirubin Urine: NEGATIVE
GLUCOSE, UA: NEGATIVE mg/dL
Hgb urine dipstick: NEGATIVE
KETONES UR: NEGATIVE mg/dL
Leukocytes, UA: NEGATIVE
Nitrite: NEGATIVE
PH: 6 (ref 5.0–8.0)
Protein, ur: NEGATIVE mg/dL
Specific Gravity, Urine: >1.03 — ABNORMAL HIGH (ref 1.005–1.030)
Urobilinogen, UA: 0.2 mg/dL (ref 0.0–1.0)

## 2014-04-08 LAB — BASIC METABOLIC PANEL
BUN: 11 mg/dL (ref 6–23)
CALCIUM: 9.2 mg/dL (ref 8.4–10.5)
CO2: 29 meq/L (ref 19–32)
Chloride: 104 mEq/L (ref 96–112)
Creat: 0.89 mg/dL (ref 0.50–1.10)
GLUCOSE: 100 mg/dL — AB (ref 70–99)
Potassium: 4.1 mEq/L (ref 3.5–5.3)
SODIUM: 139 meq/L (ref 135–145)

## 2014-04-08 LAB — CBC WITH DIFFERENTIAL/PLATELET
BASOS PCT: 0 % (ref 0–1)
Basophils Absolute: 0 10*3/uL (ref 0.0–0.1)
Eosinophils Absolute: 0.2 10*3/uL (ref 0.0–0.7)
Eosinophils Relative: 2 % (ref 0–5)
HCT: 39 % (ref 36.0–46.0)
Hemoglobin: 13.1 g/dL (ref 12.0–15.0)
Lymphocytes Relative: 40 % (ref 12–46)
Lymphs Abs: 3.4 10*3/uL (ref 0.7–4.0)
MCH: 27.1 pg (ref 26.0–34.0)
MCHC: 33.6 g/dL (ref 30.0–36.0)
MCV: 80.7 fL (ref 78.0–100.0)
MONOS PCT: 6 % (ref 3–12)
Monocytes Absolute: 0.5 10*3/uL (ref 0.1–1.0)
NEUTROS PCT: 52 % (ref 43–77)
Neutro Abs: 4.4 10*3/uL (ref 1.7–7.7)
Platelets: 348 10*3/uL (ref 150–400)
RBC: 4.83 MIL/uL (ref 3.87–5.11)
RDW: 14.2 % (ref 11.5–15.5)
WBC: 8.4 10*3/uL (ref 4.0–10.5)

## 2014-04-08 LAB — TSH: TSH: 1.283 u[IU]/mL (ref 0.350–4.500)

## 2014-04-09 LAB — H. PYLORI ANTIBODY, IGG: H Pylori IgG: 0.48 {ISR}

## 2014-04-10 ENCOUNTER — Ambulatory Visit (HOSPITAL_COMMUNITY)
Admission: RE | Admit: 2014-04-10 | Discharge: 2014-04-10 | Disposition: A | Discharge status: Home / Self Care | Payer: 59 | Source: Ambulatory Visit | Attending: Family Medicine | Admitting: Family Medicine

## 2014-04-10 DIAGNOSIS — R140 Abdominal distension (gaseous): Secondary | ICD-10-CM | POA: Insufficient documentation

## 2014-04-10 DIAGNOSIS — M25472 Effusion, left ankle: Secondary | ICD-10-CM

## 2014-04-10 DIAGNOSIS — R110 Nausea: Secondary | ICD-10-CM | POA: Insufficient documentation

## 2014-04-10 LAB — NMR LIPOPROFILE WITH LIPIDS
CHOLESTEROL, TOTAL: 153 mg/dL (ref 100–199)
HDL PARTICLE NUMBER: 35.4 umol/L (ref >=30.5)
HDL Size: 8.3 nm — ABNORMAL LOW (ref >=9.2)
HDL-C: 43 mg/dL (ref >39)
LARGE HDL: 1.7 umol/L — AB (ref >=4.8)
LDL (calc): 85 mg/dL (ref 0–99)
LDL Particle Number: 997 nmol/L (ref <1000)
LDL SIZE: 20.7 nm (ref >=20.8)
LP-IR Score: 77 — ABNORMAL HIGH (ref <=45)
Large VLDL-P: 6.5 nmol/L — ABNORMAL HIGH (ref <=2.7)
Small LDL Particle Number: 451 nmol/L (ref <=527)
Triglycerides: 124 mg/dL (ref 0–149)
VLDL Size: 51 nm — ABNORMAL HIGH (ref <=46.6)

## 2014-04-10 LAB — HEMOGLOBIN A1C
Hgb A1c MFr Bld: 5.6 % (ref <5.7)
MEAN PLASMA GLUCOSE: 114 mg/dL (ref <117)

## 2014-04-11 MED ORDER — ROSUVASTATIN CALCIUM 5 MG PO TABS: 5.0000 mg | ORAL_TABLET | Freq: Every day | ORAL | Status: DC

## 2014-04-11 NOTE — Addendum Note (Signed)
Addended by: Abner GreenspanHUDY, BRANDI H on: 04/11/2014 01:17 PM   Modules accepted: Orders

## 2014-05-15 DIAGNOSIS — G4710 Hypersomnia, unspecified: Secondary | ICD-10-CM

## 2014-05-19 ENCOUNTER — Encounter (HOSPITAL_COMMUNITY): Payer: Self-pay

## 2014-05-19 ENCOUNTER — Emergency Department (HOSPITAL_COMMUNITY)
Admission: EM | Admit: 2014-05-19 | Discharge: 2014-05-19 | Disposition: A | Discharge status: Home / Self Care | Payer: 59 | Source: Home / Self Care | Attending: Family Medicine | Admitting: Family Medicine

## 2014-05-19 DIAGNOSIS — R221 Localized swelling, mass and lump, neck: Secondary | ICD-10-CM

## 2014-05-19 DIAGNOSIS — G4710 Hypersomnia, unspecified: Secondary | ICD-10-CM

## 2014-05-19 MED ORDER — AMOXICILLIN-POT CLAVULANATE 875-125 MG PO TABS: 1.0000 | ORAL_TABLET | Freq: Two times a day (BID) | ORAL | Status: DC

## 2014-05-19 MED ORDER — FLUCONAZOLE 150 MG PO TABS: 150.0000 mg | ORAL_TABLET | Freq: Once | ORAL | Status: DC

## 2014-05-19 NOTE — ED Provider Notes (Signed)
Shawna Gonzales is a 33 y.o. female who presents to Urgent Care today for Left neck pain and swelling present for 2 days. Patient has left-sided swelling at the angle of the jaw. She notes mild tenderness. She has no trouble breathing or swallowing. The pain is mildly worse with swallowing. No fevers or chills nausea vomiting or diarrhea. She feels well otherwise. She has tried ibuprofen which helps a little.   Past Medical History  Diagnosis Date  . Macromastia   . Obesity   . Contraceptive management   . High cholesterol   . GERD (gastroesophageal reflux disease)    Past Surgical History  Procedure Laterality Date  . Wisdom tooth extraction    . Breast surgery     History  Substance Use Topics  . Smoking status: Never Smoker   . Smokeless tobacco: Not on file  . Alcohol Use: No   ROS as above Medications: No current facility-administered medications for this encounter.   Current Outpatient Prescriptions  Medication Sig Dispense Refill  . rosuvastatin (CRESTOR) 5 MG tablet Take 1 tablet (5 mg total) by mouth daily. 90 tablet 1  . amoxicillin-clavulanate (AUGMENTIN) 875-125 MG per tablet Take 1 tablet by mouth every 12 (twelve) hours. 14 tablet 0  . Fish Oil OIL by Does not apply route daily.    . fluconazole (DIFLUCAN) 150 MG tablet Take 1 tablet (150 mg total) by mouth once. 1 tablet 1  . Multiple Minerals-Vitamins (CALCIUM & VIT D3 BONE HEALTH PO) Take 1 tablet by mouth 3 (three) times daily.    . Olopatadine HCl 0.2 % SOLN Apply 1-2 drops to eye daily. 2.5 mL 0  . pantoprazole (PROTONIX) 20 MG tablet Take 1 tablet (20 mg total) by mouth daily. 90 tablet 1  . SUMAtriptan (IMITREX) 100 MG tablet May repeat in 2 hours if headache persists or recurs.  Maximum of two tablets in 24 hours  Maximum use is twice weekly 10 tablet 1  . trimethoprim-polymyxin b (POLYTRIM) ophthalmic solution Place 1-2 drops into both eyes every 4 (four) hours. 10 mL 0   No Known  Allergies   Exam:  BP 142/85 mmHg  Pulse 80  Temp(Src) 98 F (36.7 C) (Oral)  Resp 16  SpO2 96% Gen: Well NAD HEENT: EOMI,  MMM normal-appearing dentition. No elevation of the floor of the mouth or tongue. No visible dental abscess. Mild swelling and tenderness at the left submandibular area. No significant induration. Nontender along the right. Lungs: Normal work of breathing. CTABL Heart: RRR no MRG Abd: NABS, Soft. Nondistended, Nontender Exts: Brisk capillary refill, warm and well perfused.   No results found for this or any previous visit (from the past 24 hour(s)). No results found.  Assessment and Plan: 33 y.o. female with probable infection/abscess/lymphadenitis. Treatment with Augmentin and NSAIDs. Follow-up with PCP. Go to the emergency room if worse. Very doubtful for ludwig's angina.  Discussed warning signs or symptoms. Please see discharge instructions. Patient expresses understanding.     Rodolph BongEvan S Maimouna Rondeau, MD 05/19/14 [phone]

## 2014-05-19 NOTE — Discharge Instructions (Signed)
Thank you for coming in today. Call or go to the emergency room if you get worse, have trouble breathing, have chest pains, or palpitations.  Take Augmentin twice daily. Take up to 2 Aleve twice daily for pain. Go to the emergency room if you get worse Take fluconazole if you develop a yeast infection.

## 2014-05-19 NOTE — ED Notes (Signed)
States onset swelling under tongue Saturday after meal. Gets worse w eating

## 2014-05-20 ENCOUNTER — Telehealth: Payer: Self-pay | Admitting: Pulmonary Disease

## 2014-05-20 NOTE — Telephone Encounter (Signed)
Called pt and reviewed sleep study results with her.  She has some apneas/hypopneas, but not enough to meet the definition of OSA.  I asked her to work hard on weight loss, and to try avoiding sleeping on her back.  She is to call if her symptoms are worsening.

## 2014-05-21 ENCOUNTER — Encounter: Payer: Self-pay | Admitting: Pulmonary Disease

## 2014-05-21 NOTE — Telephone Encounter (Signed)
noted 

## 2014-06-24 ENCOUNTER — Encounter: Payer: Self-pay | Admitting: Family Medicine

## 2014-06-25 ENCOUNTER — Emergency Department (HOSPITAL_COMMUNITY)
Admission: EM | Admit: 2014-06-25 | Discharge: 2014-06-25 | Disposition: A | Discharge status: Home / Self Care | Payer: 59 | Source: Home / Self Care | Attending: Emergency Medicine | Admitting: Emergency Medicine

## 2014-06-25 ENCOUNTER — Encounter (HOSPITAL_COMMUNITY): Payer: Self-pay

## 2014-06-25 DIAGNOSIS — R590 Localized enlarged lymph nodes: Secondary | ICD-10-CM

## 2014-06-25 DIAGNOSIS — R599 Enlarged lymph nodes, unspecified: Secondary | ICD-10-CM

## 2014-06-25 MED ORDER — FLUCONAZOLE 150 MG PO TABS: 150.0000 mg | ORAL_TABLET | Freq: Once | ORAL | Status: DC

## 2014-06-25 MED ORDER — CEPHALEXIN 500 MG PO CAPS: 500.0000 mg | ORAL_CAPSULE | Freq: Three times a day (TID) | ORAL | Status: DC

## 2014-06-25 NOTE — Discharge Instructions (Signed)

## 2014-06-25 NOTE — ED Notes (Signed)
C/o 1 week duration of painful lump on neck

## 2014-06-25 NOTE — ED Provider Notes (Signed)
   Chief Complaint   Skin Problem   History of Present Illness   Shawna ConnersVictoria A Gonzales is a 33 year old female who has a one-week history of a painful bump in her left occipital area at the hairline. This is tender to touch. It's not drained any pus. She denies any lymphadenopathy elsewhere. She's had no fever. She denies any earache, nasal congestion, or sore throat. She denies any sores or lesions in the scalp, but has her hair in type braids.  Review of Systems   Other than as noted above, the patient denies any of the following symptoms: Systemic:  No fevers or chills. Eye:  No redness, pain, discharge, itching, blurred vision, or diplopia. ENT:  No headache, nasal congestion, sneezing, itching, epistaxis, ear pain, decreased hearing, ringing in ears, vertigo, or tinnitus.  No oral lesions, sore throat, or hoarseness. Neck:  No neck pain or adenopathy. Skin:  No rash or itching.  PMFSH   Past medical history, family history, social history, meds, and allergies were reviewed. The patient was here a little over a month ago for what was initially thought to be a dental infection, but she went back to the emergency room and was diagnosed with a salivary gland blockage.  Physical Examination     Vital signs:  BP 129/76 mmHg  Pulse 87  Temp(Src) 98.7 F (37.1 C) (Oral)  SpO2 97% General:  Alert and oriented.  In no distress.  Skin warm and dry. Eye:  PERRL, full EOMs, lids and conjunctiva normal.   ENT:  TMs and canals clear.  Nasal mucosa not congested and without drainage.  Mucous membranes moist, no oral lesions, normal dentition, pharynx clear.  No cranial or facial pain to palplation. There is no pain or swelling over the mastoid. Neck:  Supple, full ROM.  There was an 8mm, tender, lymph node in the left occipital area just at the hairline, no other lymphadenopathy noted.  Thyroid normal. Lungs:  Breath sounds clear and equal bilaterally.  No wheezes, rales or rhonchi. Heart:   Rhythm regular, without extrasystoles.  No gallops or murmers. Skin:  Clear, warm and dry. There were no lesions, sores, or ulcers on the head, face, ear, or neck.  Assessment   The encounter diagnosis was Reactive cervical lymphadenopathy.  Plan    1.  Meds:  The following meds were prescribed:   Discharge Medication List as of 06/25/2014  8:39 AM    START taking these medications   Details  cephALEXin (KEFLEX) 500 MG capsule Take 1 capsule (500 mg total) by mouth 3 (three) times daily., Starting 06/25/2014, Until Discontinued, Print       Also given a prescription for Diflucan 150 mg, #1, 1 tablet 1 time only.  2.  Patient Education/Counseling:  The patient was given appropriate handouts, self care instructions, and instructed in symptomatic relief.  Suggested follow-up with her primary care physician in 2-3 weeks.  3.  Follow up:  The patient was told to follow up here if no better in 3 to 4 days, or sooner if becoming worse in any way, and given some red flag symptoms such as fever, headache, stiff neck, or other lymphadenopathy which would prompt immediate return.       Shawna Likesavid C Annely Sliva, MD 06/25/14 [phone]

## 2014-08-06 ENCOUNTER — Ambulatory Visit: Payer: 59 | Admitting: Family Medicine

## 2014-08-18 ENCOUNTER — Encounter: Payer: Self-pay | Admitting: Family Medicine

## 2014-08-18 ENCOUNTER — Ambulatory Visit: Payer: 59 | Admitting: Family Medicine

## 2014-12-18 ENCOUNTER — Encounter: Payer: Self-pay | Admitting: Family Medicine

## 2015-02-02 ENCOUNTER — Encounter: Payer: Self-pay | Admitting: Family Medicine

## 2015-02-12 ENCOUNTER — Ambulatory Visit: Payer: 59 | Admitting: Family Medicine

## 2015-02-17 ENCOUNTER — Telehealth: Payer: Self-pay | Admitting: *Deleted

## 2015-02-17 DIAGNOSIS — E669 Obesity, unspecified: Secondary | ICD-10-CM

## 2015-02-17 DIAGNOSIS — E785 Hyperlipidemia, unspecified: Secondary | ICD-10-CM

## 2015-02-17 DIAGNOSIS — R7302 Impaired glucose tolerance (oral): Secondary | ICD-10-CM

## 2015-02-17 NOTE — Telephone Encounter (Signed)
Pt aware.

## 2015-02-17 NOTE — Telephone Encounter (Signed)
Pt called Shawna Gonzales during lunch stating she wants a lab order sent to [facility] in [place] on [address], pt is requesting the nurse to call her to let her know when it is sent.

## 2015-03-05 ENCOUNTER — Ambulatory Visit: Payer: 59 | Admitting: Family Medicine

## 2015-03-20 ENCOUNTER — Other Ambulatory Visit: Payer: Self-pay

## 2015-03-20 DIAGNOSIS — G43009 Migraine without aura, not intractable, without status migrainosus: Secondary | ICD-10-CM

## 2015-03-20 MED ORDER — SUMATRIPTAN SUCCINATE 100 MG PO TABS: ORAL_TABLET | ORAL | Status: DC

## 2015-03-31 ENCOUNTER — Encounter: Payer: Self-pay | Admitting: Family Medicine

## 2015-03-31 ENCOUNTER — Other Ambulatory Visit: Payer: Self-pay | Admitting: Family Medicine

## 2015-03-31 MED ORDER — AZELASTINE HCL 0.1 % NA SOLN: 2.0000 | Freq: Two times a day (BID) | NASAL | Status: DC

## 2015-05-19 LAB — CBC WITH DIFFERENTIAL/PLATELET
BASOS PCT: 0 % (ref 0–1)
Basophils Absolute: 0 10*3/uL (ref 0.0–0.1)
EOS ABS: 0.1 10*3/uL (ref 0.0–0.7)
EOS PCT: 1 % (ref 0–5)
HCT: 38.3 % (ref 36.0–46.0)
Hemoglobin: 12.7 g/dL (ref 12.0–15.0)
LYMPHS ABS: 3.5 10*3/uL (ref 0.7–4.0)
Lymphocytes Relative: 43 % (ref 12–46)
MCH: 27.2 pg (ref 26.0–34.0)
MCHC: 33.2 g/dL (ref 30.0–36.0)
MCV: 82 fL (ref 78.0–100.0)
MONO ABS: 0.4 10*3/uL (ref 0.1–1.0)
MONOS PCT: 5 % (ref 3–12)
MPV: 10.3 fL (ref 8.6–12.4)
Neutro Abs: 4.2 10*3/uL (ref 1.7–7.7)
Neutrophils Relative %: 51 % (ref 43–77)
Platelets: 319 10*3/uL (ref 150–400)
RBC: 4.67 MIL/uL (ref 3.87–5.11)
RDW: 14.6 % (ref 11.5–15.5)
WBC: 8.2 10*3/uL (ref 4.0–10.5)

## 2015-05-19 LAB — BASIC METABOLIC PANEL
BUN: 11 mg/dL (ref 7–25)
CALCIUM: 9.4 mg/dL (ref 8.6–10.2)
CHLORIDE: 103 mmol/L (ref 98–110)
CO2: 29 mmol/L (ref 20–31)
CREATININE: 0.67 mg/dL (ref 0.50–1.10)
Glucose, Bld: 89 mg/dL (ref 65–99)
Potassium: 4.1 mmol/L (ref 3.5–5.3)
Sodium: 139 mmol/L (ref 135–146)

## 2015-05-19 LAB — LIPID PANEL
Cholesterol: 156 mg/dL (ref 125–200)
HDL: 43 mg/dL — AB (ref >=46)
LDL CALC: 91 mg/dL (ref <130)
Total CHOL/HDL Ratio: 3.6 Ratio (ref <=5.0)
Triglycerides: 109 mg/dL (ref <150)
VLDL: 22 mg/dL (ref <30)

## 2015-05-19 LAB — HEMOGLOBIN A1C
Hgb A1c MFr Bld: 5.7 % — ABNORMAL HIGH (ref <5.7)
Mean Plasma Glucose: 117 mg/dL — ABNORMAL HIGH (ref <117)

## 2015-05-19 LAB — TSH: TSH: 1.284 u[IU]/mL (ref 0.350–4.500)

## 2015-06-04 ENCOUNTER — Encounter: Payer: Self-pay | Admitting: Family Medicine

## 2015-06-04 ENCOUNTER — Ambulatory Visit (INDEPENDENT_AMBULATORY_CARE_PROVIDER_SITE_OTHER): Payer: 59 | Admitting: Family Medicine

## 2015-06-04 VITALS — BP 120/78 | HR 91 | Temp 98.7°F | Resp 14 | Ht 65.0 in | Wt 229.8 lb

## 2015-06-04 DIAGNOSIS — M25561 Pain in right knee: Secondary | ICD-10-CM

## 2015-06-04 DIAGNOSIS — K8020 Calculus of gallbladder without cholecystitis without obstruction: Secondary | ICD-10-CM

## 2015-06-04 DIAGNOSIS — K824 Cholesterolosis of gallbladder: Secondary | ICD-10-CM | POA: Diagnosis not present

## 2015-06-04 DIAGNOSIS — E669 Obesity, unspecified: Secondary | ICD-10-CM

## 2015-06-04 DIAGNOSIS — K219 Gastro-esophageal reflux disease without esophagitis: Secondary | ICD-10-CM | POA: Diagnosis not present

## 2015-06-04 DIAGNOSIS — G43109 Migraine with aura, not intractable, without status migrainosus: Secondary | ICD-10-CM

## 2015-06-04 DIAGNOSIS — E785 Hyperlipidemia, unspecified: Secondary | ICD-10-CM

## 2015-06-04 DIAGNOSIS — L84 Corns and callosities: Secondary | ICD-10-CM

## 2015-06-04 DIAGNOSIS — R7302 Impaired glucose tolerance (oral): Secondary | ICD-10-CM

## 2015-06-04 HISTORY — DX: Calculus of gallbladder without cholecystitis without obstruction: K80.20

## 2015-06-04 MED ORDER — PHENTERMINE HCL 37.5 MG PO TABS: 37.5000 mg | ORAL_TABLET | Freq: Every day | ORAL | Status: DC

## 2015-06-04 MED ORDER — PANTOPRAZOLE SODIUM 20 MG PO TBEC: 20.0000 mg | DELAYED_RELEASE_TABLET | Freq: Every day | ORAL | Status: DC

## 2015-06-04 NOTE — Progress Notes (Signed)
Subjective:    Patient ID: Shawna Gonzales, female    DOB: 1981-03-29, 34 y.o.   MRN: [id number]  HPI   Shawna Gonzales     MRN: [id number]      DOB: 04-23-1981   HPI Shawna Gonzales is here for follow up and re-evaluation of chronic medical conditions, medication management and review of any available recent lab and radiology data.  Preventive health is updated, specifically  Cancer screening and Immunization.   Questions or concerns regarding consultations or procedures which the PT has had in the interim are  addressed. The PT denies any adverse reactions to current medications since the last visit.  There are no new concerns.  There are no specific complaints   ROS Denies recent fever or chills. Denies sinus pressure, nasal congestion, ear pain or sore throat. Denies chest congestion, productive cough or wheezing. Denies chest pains, palpitations and leg swelling Denies abdominal pain, nausea, vomiting,diarrhea or constipation.   Denies dysuria, frequency, hesitancy or incontinence. Denies joint pain, swelling and limitation in mobility. Denies headaches, seizures, numbness, or tingling. Denies depression, anxiety or insomnia. Denies skin break down or rash.   PE  BP 120/78 mmHg  Pulse 91  Temp(Src) 98.7 F (37.1 C)  Resp 14  Ht  (1.651 m)  Wt 229 lb 12.8 oz (104.237 kg)  BMI 38.24 kg/m2  SpO2 97%  LMP 05/09/2015 (Exact Date)  Patient alert and oriented and in no cardiopulmonary distress.  HEENT: No facial asymmetry, EOMI,   oropharynx pink and moist.  Neck supple no JVD, no mass.  Chest: Clear to auscultation bilaterally.  CVS: S1, S2 no murmurs, no S3.Regular rate.  ABD: Soft non tender.   Ext: No edema  MS: Adequate ROM spine, shoulders, hips and knees.  Skin: Intact, no ulcerations or rash noted.  Psych: Good eye contact, normal affect. Memory intact not anxious or depressed appearing.  CNS: CN 2-12 intact, power,  normal throughout.no  focal deficits noted.   Assessment & Plan  Migraine Controlled, no change in medication No need for daily prophylaxis  GERD (gastroesophageal reflux disease) Improved with weight loss, uses medication only as needed, which is appropriate  Gall bladder polyp Noted in 2015, with recommendation for 1 year f/u by radiology, rept imaging study ordered  Corns Painful on both 5th toes, refer to podiatry  Impaired glucose tolerance Deteriorated Patient educated about the importance of limiting  Carbohydrate intake , the need to commit to daily physical activity for a minimum of 30 minutes , and to commit weight loss. The fact that changes in all these areas will reduce or eliminate all together the development of diabetes is stressed.  Updated lab needed at/ before next visit.   Diabetic Labs Latest Ref Rng 05/18/2015 04/08/2014 01/02/2014 02/19/2013 12/07/2012  HbA1c <5.7 % 5.7(H) 5.6 5.5 - -  Chol 125 - 200 mg/dL 562 130 865 784 696  HDL >=46 mg/dL 29(B) 43 28(U) 49 42  Calc LDL <130 mg/dL 91 85 75 42 66  Triglycerides <150 mg/dL 132 440 102(V) 253 664(Q)  Creatinine 0.50 - 1.10 mg/dL 0.34 7.42 - 5.95 6.38   BP/Weight 06/04/2015 06/25/2014 05/19/2014 04/03/2014 03/19/2014 03/12/2014 01/06/2014  Systolic BP 120 129 142 120 118 138 118  Diastolic BP 78 76 85 82 62 78 74  Wt. (Lbs) 229.8 - - 246.4 248 - 242  BMI 38.24 - - 40.36 40.63 - 39.08   No flowsheet data found.     Obesity Improved,  start half phentermine daily, educated re need to commit to lifestyle change so weight loss is achieved , also avoidance of pregnancy. Adverse s/e and black box warning also discussed Patient re-educated about  the importance of commitment to a  minimum of 150 minutes of exercise per week.  The importance of healthy food choices with portion control discussed. Encouraged to start a food diary, count calories and to consider  joining a support group. Sample diet sheets offered. Goals set by the patient  for the next several months.   Weight /BMI 06/04/2015 04/03/2014 03/19/2014  WEIGHT 229 lb 12.8 oz 246 lb 6.4 oz 248 lb  HEIGHT 5\' 5"  5' 5.5" 5' 5.5"  BMI 38.24 kg/m2 40.36 kg/m2 40.63 kg/m2    Current exercise per week 135 minutes.   Dyslipidemia lDL above goal and low hDL, lifestyle modification only, pt discontinued crestor, which is appropriate Updated lab needed at/ before next visit.        Review of Systems     Objective:   Physical Exam        Assessment & Plan:

## 2015-06-04 NOTE — Patient Instructions (Addendum)
F/U in 4 month call if you need me before  Start phentermine HALF daily every morning , commit to daily exercise for 30 to 45 mins , and cHANGE and strusture eating  SCHEDULE appt with nutrtioinist  Weight loss goal of 3 ro 4 pounds per month  You are referred for US of gall bladder / liver  You are referred to podiatry re bilateral painful corns  CONGRATS on WEIGHT LOSS  All the best for 2017!  [facility], we consider it a privelige to serve you.

## 2015-06-06 ENCOUNTER — Encounter: Payer: Self-pay | Admitting: Family Medicine

## 2015-06-06 NOTE — Assessment & Plan Note (Signed)
lDL above goal and low hDL, lifestyle modification only, pt discontinued crestor, which is appropriate Updated lab needed at/ before next visit.

## 2015-06-06 NOTE — Assessment & Plan Note (Signed)
Deteriorated Patient educated about the importance of limiting  Carbohydrate intake , the need to commit to daily physical activity for a minimum of 30 minutes , and to commit weight loss. The fact that changes in all these areas will reduce or eliminate all together the development of diabetes is stressed.  Updated lab needed at/ before next visit.   Diabetic Labs Latest Ref Rng 05/18/2015 04/08/2014 01/02/2014 02/19/2013 12/07/2012  HbA1c <5.7 % 5.7(H) 5.6 5.5 - -  Chol 125 - 200 mg/dL [id number] 454153 098141 119117 147146  HDL >=46 mg/dL 82(N43(L) 43 56(O36(L) 49 42  Calc LDL <130 mg/dL 91 85 75 42 66  Triglycerides <150 mg/dL 130109 865124 784(O151(H) 962131 952(W190(H)  Creatinine 0.50 - 1.10 mg/dL 4.130.67 2.440.89 - 0.100.90 2.720.83   BP/Weight 06/04/2015 06/25/2014 05/19/2014 04/03/2014 03/19/2014 03/12/2014 01/06/2014  Systolic BP 120 129 142 120 118 138 118  Diastolic BP 78 76 85 82 62 78 74  Wt. (Lbs) 229.8 - - 246.4 248 - 242  BMI 38.24 - - 40.36 40.63 - 39.08   No flowsheet data found.

## 2015-06-06 NOTE — Assessment & Plan Note (Signed)
Noted in 2015, with recommendation for 1 year f/u by radiology, rept imaging study ordered

## 2015-06-06 NOTE — Assessment & Plan Note (Signed)
Will use tylenol OTC as needed for acute pain

## 2015-06-06 NOTE — Assessment & Plan Note (Signed)
Improved with weight loss, uses medication only as needed, which is appropriate

## 2015-06-06 NOTE — Assessment & Plan Note (Signed)
Improved, start half phentermine daily, educated re need to commit to lifestyle change so weight loss is achieved , also avoidance of pregnancy. Adverse s/e and black box warning also discussed Patient re-educated about  the importance of commitment to a  minimum of 150 minutes of exercise per week.  The importance of healthy food choices with portion control discussed. Encouraged to start a food diary, count calories and to consider  joining a support group. Sample diet sheets offered. Goals set by the patient for the next several months.   Weight /BMI 06/04/2015 04/03/2014 03/19/2014  WEIGHT 229 lb 12.8 oz 246 lb 6.4 oz 248 lb  HEIGHT 5\' 5"  5' 5.5" 5' 5.5"  BMI 38.24 kg/m2 40.36 kg/m2 40.63 kg/m2    Current exercise per week 135 minutes.

## 2015-06-06 NOTE — Assessment & Plan Note (Signed)
Controlled, no change in medication No need for daily prophylaxis

## 2015-06-06 NOTE — Assessment & Plan Note (Signed)
Painful on both 5th toes, refer to podiatry

## 2015-06-09 ENCOUNTER — Encounter: Payer: Self-pay | Admitting: Family

## 2015-06-09 ENCOUNTER — Telehealth: Payer: 59 | Admitting: Family

## 2015-06-09 DIAGNOSIS — B3731 Acute candidiasis of vulva and vagina: Secondary | ICD-10-CM

## 2015-06-09 DIAGNOSIS — J0120 Acute ethmoidal sinusitis, unspecified: Secondary | ICD-10-CM | POA: Diagnosis not present

## 2015-06-09 DIAGNOSIS — B373 Candidiasis of vulva and vagina: Secondary | ICD-10-CM | POA: Diagnosis not present

## 2015-06-09 MED ORDER — AMOXICILLIN-POT CLAVULANATE 875-125 MG PO TABS: 1.0000 | ORAL_TABLET | Freq: Two times a day (BID) | ORAL | Status: AC

## 2015-06-09 NOTE — Progress Notes (Signed)
We are sorry that you are not feeling well.  Here is how we plan to help!  Based on what you have shared with me it looks like you have sinusitis.  Sinusitis is inflammation and infection in the sinus cavities of the head.  Based on your presentation I believe you most likely have Acute Bacterial Sinusitis.  This is an infection caused by bacteria and is treated with antibiotics. I have prescribed Augmentin, an antibiotic in the penicillin family, one tablet twice daily with food, for 7 days. You may use an oral decongestant such as Mucinex D or if you have glaucoma or high blood pressure use plain Mucinex. Saline nasal spray help and can safely be used as often as needed for congestion.  If you develop worsening sinus pain, fever or notice severe headache and vision changes, or if symptoms are not better after completion of antibiotic, please schedule an appointment with a health care provider.    Sinus infections are not as easily transmitted as other respiratory infection, however we still recommend that you avoid close contact with loved ones, especially the very young and elderly.  Remember to wash your hands thoroughly throughout the day as this is the number one way to prevent the spread of infection!  Home Care:  Only take medications as instructed by your medical team.  Complete the entire course of an antibiotic.  Do not take these medications with alcohol.  A steam or ultrasonic humidifier can help congestion.  You can place a towel over your head and breathe in the steam from hot water coming from a faucet.  Avoid close contacts especially the very young and the elderly.  Cover your mouth when you cough or sneeze.  Always remember to wash your hands.  Get Help Right Away If:  You develop worsening fever or sinus pain.  You develop a severe head ache or visual changes.  Your symptoms persist after you have completed your treatment plan.  Make sure you  Understand these  instructions.  Will watch your condition.  Will get help right away if you are not doing well or get worse.  Your e-visit answers were reviewed by a board certified advanced clinical practitioner to complete your personal care plan.  Depending on the condition, your plan could have included both over the counter or prescription medications.  If there is a problem please reply  once you have received a response from your provider.  Your safety is important to us.  If you have drug allergies check your prescription carefully.    You can use MyChart to ask questions about today's visit, request a non-urgent call back, or ask for a work or school excuse for 24 hours related to this e-Visit. If it has been greater than 24 hours you will need to follow up with your provider, or enter a new e-Visit to address those concerns.  You will get an e-mail in the next two days asking about your experience.  I hope that your e-visit has been valuable and will speed your recovery. Thank you for using e-visits.    

## 2015-06-12 ENCOUNTER — Other Ambulatory Visit: Payer: 59

## 2015-06-15 MED ORDER — FLUCONAZOLE 150 MG PO TABS
150.0000 mg | ORAL_TABLET | Freq: Once | ORAL | Status: DC
Start: 2015-06-15 — End: 2016-07-18

## 2015-06-15 NOTE — Addendum Note (Signed)
Addended by: Beau FannyWITHROW, Teagen Bucio C on: 06/15/2015 10:31 AM   Modules accepted: Orders

## 2015-06-19 ENCOUNTER — Other Ambulatory Visit: Payer: 59

## 2015-06-25 ENCOUNTER — Encounter: Payer: Self-pay | Admitting: Family Medicine

## 2015-07-07 ENCOUNTER — Encounter: Payer: Self-pay | Admitting: Family Medicine

## 2015-07-30 MED FILL — SUMATRIPTAN SUCC 100 MG TAB: 100 | 30 days supply | Qty: 9 | Fill #1

## 2015-09-16 ENCOUNTER — Telehealth: Payer: Self-pay | Admitting: Family Medicine

## 2015-09-16 ENCOUNTER — Encounter: Payer: Self-pay | Admitting: Family Medicine

## 2015-09-16 NOTE — Telephone Encounter (Signed)
Patient is asking if Dr. Lodema HongSimpson would call her in something for the flu, she is staying with her sister and her nephew and brother in law both are positive for the flu and she is asking if there is something as a preventative

## 2015-09-16 NOTE — Telephone Encounter (Signed)
Preventive is hand hygiene, and use of face mask by infected individuals.  If she develops flu symptoms call in / my chart viist within first 48 hrs,  tamiflu will be prescribed then   Pls explain

## 2015-09-17 NOTE — Telephone Encounter (Signed)
Left message and responded to [facility] [facility] message

## 2016-03-09 ENCOUNTER — Encounter: Payer: Self-pay | Admitting: Family Medicine

## 2016-03-09 ENCOUNTER — Other Ambulatory Visit: Payer: Self-pay

## 2016-03-09 DIAGNOSIS — G43009 Migraine without aura, not intractable, without status migrainosus: Secondary | ICD-10-CM

## 2016-03-09 MED ORDER — SUMATRIPTAN SUCCINATE 100 MG PO TABS: ORAL_TABLET | ORAL | 0 refills | Status: DC

## 2016-04-15 ENCOUNTER — Telehealth: Payer: Self-pay | Admitting: Family Medicine

## 2016-04-15 DIAGNOSIS — Z1329 Encounter for screening for other suspected endocrine disorder: Secondary | ICD-10-CM

## 2016-04-15 DIAGNOSIS — Z131 Encounter for screening for diabetes mellitus: Secondary | ICD-10-CM

## 2016-04-15 DIAGNOSIS — Z0000 Encounter for general adult medical examination without abnormal findings: Secondary | ICD-10-CM

## 2016-04-15 NOTE — Telephone Encounter (Signed)
Please get lab orders updated in the computer, Patient has an appointment on Nov 8th, Thanks

## 2016-04-21 NOTE — Telephone Encounter (Signed)
Labs ordered.

## 2016-04-25 ENCOUNTER — Encounter: Payer: Self-pay | Admitting: Family Medicine

## 2016-04-25 ENCOUNTER — Telehealth: Payer: 59 | Admitting: Family

## 2016-04-25 DIAGNOSIS — N390 Urinary tract infection, site not specified: Secondary | ICD-10-CM

## 2016-04-25 MED ORDER — SULFAMETHOXAZOLE-TRIMETHOPRIM 800-160 MG PO TABS: 1.0000 | ORAL_TABLET | Freq: Two times a day (BID) | ORAL | 0 refills | Status: DC

## 2016-04-25 MED FILL — SULFAMETHOXAZOLE/TMP DS TAB: 800-160 | 5 days supply | Qty: 10 | Fill #0

## 2016-04-25 NOTE — Progress Notes (Signed)

## 2016-04-26 MED ORDER — FLUCONAZOLE 150 MG PO TABS: 150.0000 mg | ORAL_TABLET | Freq: Once | ORAL | 0 refills | Status: AC

## 2016-04-26 MED FILL — FLUCONAZOLE 150 MG TABLET: 150 | 1 days supply | Qty: 1 | Fill #0

## 2016-04-26 NOTE — Addendum Note (Signed)
Addended by: Worthy RancherWEBB, Levetta Bognar B on: 04/26/2016 07:57 AM   Modules accepted: Orders

## 2016-05-06 ENCOUNTER — Encounter: Payer: Self-pay | Admitting: Family Medicine

## 2016-05-11 ENCOUNTER — Ambulatory Visit: Payer: 59 | Admitting: Family Medicine

## 2016-06-13 ENCOUNTER — Ambulatory Visit: Payer: 59 | Admitting: Family Medicine

## 2016-06-15 ENCOUNTER — Encounter: Payer: Self-pay | Admitting: Family Medicine

## 2016-06-15 ENCOUNTER — Ambulatory Visit: Payer: 59 | Admitting: Family Medicine

## 2016-06-29 ENCOUNTER — Ambulatory Visit: Payer: Self-pay | Admitting: Family Medicine

## 2016-06-29 ENCOUNTER — Encounter: Payer: Self-pay | Admitting: Family Medicine

## 2016-07-11 LAB — CBC
HEMATOCRIT: 40 % (ref 35.0–45.0)
Hemoglobin: 12.8 g/dL (ref 11.7–15.5)
MCH: 26.4 pg — AB (ref 27.0–33.0)
MCHC: 32 g/dL (ref 32.0–36.0)
MCV: 82.6 fL (ref 80.0–100.0)
MPV: 10.3 fL (ref 7.5–12.5)
Platelets: 317 10*3/uL (ref 140–400)
RBC: 4.84 MIL/uL (ref 3.80–5.10)
RDW: 14.4 % (ref 11.0–15.0)
WBC: 7.3 10*3/uL (ref 3.8–10.8)

## 2016-07-11 LAB — HEMOGLOBIN A1C
Hgb A1c MFr Bld: 5.2 % (ref <5.7)
Mean Plasma Glucose: 103 mg/dL

## 2016-07-12 ENCOUNTER — Encounter: Payer: Self-pay | Admitting: Family Medicine

## 2016-07-12 LAB — LIPID PANEL
Cholesterol: 156 mg/dL (ref <200)
HDL: 44 mg/dL — AB (ref >50)
LDL CALC: 86 mg/dL (ref <100)
TRIGLYCERIDES: 129 mg/dL (ref <150)
Total CHOL/HDL Ratio: 3.5 Ratio (ref <5.0)
VLDL: 26 mg/dL (ref <30)

## 2016-07-12 LAB — BASIC METABOLIC PANEL
BUN: 10 mg/dL (ref 7–25)
CO2: 27 mmol/L (ref 20–31)
Calcium: 9.5 mg/dL (ref 8.6–10.2)
Chloride: 104 mmol/L (ref 98–110)
Creat: 0.86 mg/dL (ref 0.50–1.10)
GLUCOSE: 90 mg/dL (ref 65–99)
POTASSIUM: 4.3 mmol/L (ref 3.5–5.3)
Sodium: 139 mmol/L (ref 135–146)

## 2016-07-12 LAB — TSH: TSH: 1.02 mIU/L

## 2016-07-13 ENCOUNTER — Other Ambulatory Visit: Payer: Self-pay | Admitting: Family Medicine

## 2016-07-13 DIAGNOSIS — G43009 Migraine without aura, not intractable, without status migrainosus: Secondary | ICD-10-CM

## 2016-07-18 ENCOUNTER — Ambulatory Visit (INDEPENDENT_AMBULATORY_CARE_PROVIDER_SITE_OTHER): Payer: Commercial Managed Care - PPO | Admitting: Emergency Medicine

## 2016-07-18 VITALS — BP 132/84 | HR 84 | Temp 97.9°F | Ht 65.0 in | Wt 239.6 lb

## 2016-07-18 DIAGNOSIS — J029 Acute pharyngitis, unspecified: Secondary | ICD-10-CM

## 2016-07-18 DIAGNOSIS — K219 Gastro-esophageal reflux disease without esophagitis: Secondary | ICD-10-CM | POA: Diagnosis not present

## 2016-07-18 DIAGNOSIS — R070 Pain in throat: Secondary | ICD-10-CM

## 2016-07-18 DIAGNOSIS — R0981 Nasal congestion: Secondary | ICD-10-CM | POA: Diagnosis not present

## 2016-07-18 MED ORDER — FLUCONAZOLE 150 MG PO TABS: 150.0000 mg | ORAL_TABLET | Freq: Once | ORAL | 1 refills | Status: AC

## 2016-07-18 MED ORDER — AZITHROMYCIN 250 MG PO TABS: ORAL_TABLET | ORAL | 0 refills | Status: DC

## 2016-07-18 MED FILL — AZITHROMYCIN 250 MG TABLET: 250 | 5 days supply | Qty: 6 | Fill #0

## 2016-07-18 MED FILL — FLUCONAZOLE 150 MG TABLET: 150 | 1 days supply | Qty: 1 | Fill #0

## 2016-07-18 NOTE — Progress Notes (Signed)
Shawna Gonzales 36 y.o.   Chief Complaint  Patient presents with  . Sore Throat    X 1 week  . Nasal Congestion    X 1 week    HISTORY OF PRESENT ILLNESS: This is a 36 y.o. female complaining of 1 week h/o sore throat and nasal congestion; recently had dental work and also had flu-like symptoms.  Sore Throat   This is a new problem. The current episode started in the past 7 days. The problem has been gradually worsening. There has been no fever. The pain is at a severity of 2/10. The pain is mild. Associated symptoms include congestion. Pertinent negatives include no abdominal pain, coughing, diarrhea, ear pain, headaches, neck pain, shortness of breath, swollen glands, trouble swallowing or vomiting. She has tried cool liquids and acetaminophen for the symptoms. The treatment provided no relief.     Prior to Admission medications   Medication Sig Start Date End Date Taking? Authorizing Provider  pantoprazole (PROTONIX) 20 MG tablet Take 1 tablet (20 mg total) by mouth daily. 06/04/15  Yes Kerri Perches, MD  SUMAtriptan (IMITREX) 100 MG tablet May repeat in 2 hours if headache persists or recurs.  Maximum of two tablets in 24 hours  Maximum use is twice weekly 03/09/16  Yes Kerri Perches, MD  azelastine (ASTELIN) 0.1 % nasal spray Place 2 sprays into both nostrils 2 (two) times daily. Use in each nostril as directed Patient not taking: Reported on 07/18/2016 03/31/15   Kerri Perches, MD  azithromycin [facility]) 250 MG tablet Sig as indicated 07/18/16   Georgina Quint, MD  fluconazole (DIFLUCAN) 150 MG tablet Take 1 tablet (150 mg total) by mouth once. 07/18/16 07/18/16  Georgina Quint, MD  Multiple Minerals-Vitamins (CALCIUM & VIT D3 BONE HEALTH PO) Take 1 tablet by mouth 3 (three) times daily.    Historical Provider, MD  phentermine (ADIPEX-P) 37.5 MG tablet Take 1 tablet (37.5 mg total) by mouth daily before breakfast. Patient not taking: Reported on  07/18/2016 06/04/15   Kerri Perches, MD  sulfamethoxazole-trimethoprim (BACTRIM DS,SEPTRA DS) 800-160 MG tablet Take 1 tablet by mouth 2 (two) times daily. Patient not taking: Reported on 07/18/2016 04/25/16   Beau Fanny, FNP    No Known Allergies  Patient Active Problem List   Diagnosis Date Noted  . Gall bladder polyp 06/04/2015  . Corns 06/04/2015  . GERD (gastroesophageal reflux disease) 04/03/2014  . Migraine 04/03/2014  . FH: CAD (coronary artery disease) 04/03/2014  . 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 OSA (obstructive sleep apnea) 01/06/2014  . 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 Allergic rhinitis 11/02/2011  . Dyslipidemia 05/23/2011  . Impaired glucose tolerance 05/23/2011  . INTERNAL HEMORRHOIDS 06/08/2010  . KNEE PAIN, RIGHT, CHRONIC 11/24/2009  . Obesity 07/24/2007    Past Medical History:  Diagnosis Date  . Contraceptive management   . GERD (gastroesophageal reflux disease)   . High cholesterol   . Macromastia   . Obesity     Past Surgical History:  Procedure Laterality Date  . BREAST SURGERY    . WISDOM TOOTH EXTRACTION      Social History   Social History  . Marital status: Single    Spouse name: N/A  . Number of children: 0  . Years of education: N/A   Occupational History  . physical therapist at [employer]    .  [place]   Social History Main Topics  .  Smoking status: Never Smoker  . Smokeless tobacco: Never Used  . Alcohol use No  . Drug use: No  . Sexual activity: Not Currently   Other Topics Concern  . Not on file   Social History Narrative  . No narrative on file    Family History  Problem Relation Age of Onset  . Heart attack Mother   . Hyperlipidemia Father   . Heart attack Maternal Grandfather      Review of Systems  Constitutional: Negative for chills, fever and malaise/fatigue.  HENT: Positive for congestion and sore throat. Negative for ear pain, nosebleeds, sinus pain and  trouble swallowing.   Eyes: Negative for discharge and redness.  Respiratory: Negative for cough and shortness of breath.   Cardiovascular: Negative for chest pain and palpitations.  Gastrointestinal: Negative for abdominal pain, diarrhea and vomiting.  Genitourinary: Negative.   Musculoskeletal: Negative for myalgias and neck pain.  Skin: Negative for rash.  Neurological: Negative for dizziness and headaches.  Endo/Heme/Allergies: Negative.   Psychiatric/Behavioral: Negative.   All other systems reviewed and are negative.  Vitals:   07/18/16 0926  BP: 132/84  Pulse: 84  Temp: 97.9 F (36.6 C)     Physical Exam  Constitutional: She is oriented to person, place, and time. She appears well-developed and well-nourished.  HENT:  Head: Normocephalic and atraumatic.  Nose: Mucosal edema present.  Mouth/Throat: Posterior oropharyngeal erythema present. No oropharyngeal exudate.  Eyes: Conjunctivae and EOM are normal. Pupils are equal, round, and reactive to light.  Neck: Normal range of motion. Neck supple.  Cardiovascular: Normal rate, regular rhythm and normal heart sounds.   Pulmonary/Chest: Effort normal and breath sounds normal.  Abdominal: Soft. She exhibits no distension. There is no tenderness.  Musculoskeletal: Normal range of motion.  Lymphadenopathy:    She has no cervical adenopathy.  Neurological: She is alert and oriented to person, place, and time. No sensory deficit. She exhibits normal muscle tone.  Skin: Skin is warm and dry. Capillary refill takes less than 2 seconds.  Psychiatric: She has a normal mood and affect. Her behavior is normal.  Vitals reviewed.    ASSESSMENT & PLAN: Shawna Gonzales was seen today for sore throat and nasal congestion.  Diagnoses and all orders for this visit:  Acute pharyngitis, unspecified etiology  Throat pain in adult  Nasal congestion  Gastroesophageal reflux disease, esophagitis presence not specified  Other orders -      fluconazole (DIFLUCAN) 150 MG tablet; Take 1 tablet (150 mg total) by mouth once. -     azithromycin (ZITHROMAX) 250 MG tablet; Sig as indicated    Patient Instructions       IF you received an x-ray today, you will receive an invoice from [facility]. Please contact [facility] at [phone] with questions or concerns regarding your invoice.   IF you received labwork today, you will receive an invoice from [facility]. Please contact [facility] at [phone] with questions or concerns regarding your invoice.   Our billing staff will not be able to assist you with questions regarding bills from these companies.  You will be contacted with the lab results as soon as they are available. The fastest way to get your results is to activate your My Chart account. Instructions are located on the last page of this paperwork. If you have not heard from [place] regarding the results in 2 weeks, please contact this office.     Pharyngitis Pharyngitis is a sore throat (pharynx). There is redness, pain, and  swelling of your throat. Follow these instructions at home:  Drink enough fluids to keep your pee (urine) clear or pale yellow.  Only take medicine as told by your doctor.  You may get sick again if you do not take medicine as told. Finish your medicines, even if you start to feel better.  Do not take aspirin.  Rest.  Rinse your mouth (gargle) with salt water ( tsp of salt per 1 qt of water) every 1-2 hours. This will help the pain.  If you are not at risk for choking, you can suck on hard candy or sore throat lozenges. Contact a doctor if:  You have large, tender lumps on your neck.  You have a rash.  You cough up green, yellow-brown, or bloody spit. Get help right away if:  You have a stiff neck.  You drool or cannot swallow liquids.  You throw up (vomit) or are not able to keep medicine or liquids down.  You have very bad pain that does not go away with  medicine.  You have problems breathing (not from a stuffy nose). This information is not intended to replace advice given to you by your health care provider. Make sure you discuss any questions you have with your health care provider. Document Released: 12/07/2007 Document Revised: 11/26/2015 Document Reviewed: 02/25/2013 Elsevier Interactive Patient Education  2017 Elsevier Inc.      Edwina BarthMiguel Cordae Mccarey, MD [facility]

## 2016-07-18 NOTE — Patient Instructions (Addendum)
     IF you received an x-ray today, you will receive an invoice from [facility]. Please contact  Radiology at [phone] with questions or concerns regarding your invoice.   IF you received labwork today, you will receive an invoice from [facility]. Please contact [facility] at [phone] with questions or concerns regarding your invoice.   Our billing staff will not be able to assist you with questions regarding bills from these companies.  You will be contacted with the lab results as soon as they are available. The fastest way to get your results is to activate your My Chart account. Instructions are located on the last page of this paperwork. If you have not heard from us regarding the results in 2 weeks, please contact this office.     Pharyngitis Pharyngitis is a sore throat (pharynx). There is redness, pain, and swelling of your throat. Follow these instructions at home:  Drink enough fluids to keep your pee (urine) clear or pale yellow.  Only take medicine as told by your doctor.  You may get sick again if you do not take medicine as told. Finish your medicines, even if you start to feel better.  Do not take aspirin.  Rest.  Rinse your mouth (gargle) with salt water ( tsp of salt per 1 qt of water) every 1-2 hours. This will help the pain.  If you are not at risk for choking, you can suck on hard candy or sore throat lozenges. Contact a doctor if:  You have large, tender lumps on your neck.  You have a rash.  You cough up green, yellow-brown, or bloody spit. Get help right away if:  You have a stiff neck.  You drool or cannot swallow liquids.  You throw up (vomit) or are not able to keep medicine or liquids down.  You have very bad pain that does not go away with medicine.  You have problems breathing (not from a stuffy nose). This information is not intended to replace advice given to you by your health care provider. Make sure you  discuss any questions you have with your health care provider. Document Released: 12/07/2007 Document Revised: 11/26/2015 Document Reviewed: 02/25/2013 Elsevier Interactive Patient Education  2017 Elsevier Inc.  

## 2016-09-20 ENCOUNTER — Encounter: Payer: Self-pay | Admitting: Family Medicine

## 2016-09-20 ENCOUNTER — Ambulatory Visit (INDEPENDENT_AMBULATORY_CARE_PROVIDER_SITE_OTHER): Payer: 59 | Admitting: Family Medicine

## 2016-09-20 VITALS — BP 102/68 | HR 99 | Resp 15 | Ht 65.0 in | Wt 238.0 lb

## 2016-09-20 DIAGNOSIS — G4730 Sleep apnea, unspecified: Secondary | ICD-10-CM

## 2016-09-20 DIAGNOSIS — M25562 Pain in left knee: Secondary | ICD-10-CM

## 2016-09-20 DIAGNOSIS — G43109 Migraine with aura, not intractable, without status migrainosus: Secondary | ICD-10-CM | POA: Diagnosis not present

## 2016-09-20 DIAGNOSIS — J302 Other seasonal allergic rhinitis: Secondary | ICD-10-CM | POA: Diagnosis not present

## 2016-09-20 DIAGNOSIS — R7302 Impaired glucose tolerance (oral): Secondary | ICD-10-CM

## 2016-09-20 MED ORDER — IBUPROFEN 800 MG PO TABS: 800.0000 mg | ORAL_TABLET | Freq: Three times a day (TID) | ORAL | 1 refills | Status: DC | PRN

## 2016-09-20 MED ORDER — FLUTICASONE PROPIONATE 50 MCG/ACT NA SUSP: 2.0000 | Freq: Every day | NASAL | 2 refills | Status: DC

## 2016-09-20 MED ORDER — ACETAMINOPHEN 500 MG PO CHEW: 500.0000 mg | CHEWABLE_TABLET | Freq: Four times a day (QID) | ORAL | 0 refills | Status: DC | PRN

## 2016-09-20 NOTE — Assessment & Plan Note (Signed)
Uncontrolled, relies on daily afrin, discouraged this , she will commit to daily steroid nasal spray , if still experiencing excessive narrowing of nasal passage may need ENT exam

## 2016-09-20 NOTE — Patient Instructions (Addendum)
f/u in 6 month, call if you need me before  It is important that you exercise regularly at least 30 minutes 6 times a week. If you develop chest pain, have severe difficulty breathing, or feel very tired, stop exercising immediately and seek medical attention   Weight loss goal of 10 to 12 pounds  Dietary changes as discussed  CONGRATS normal blood sugar   Arthritis ij knee needs ibuprofen twice daily for 5 days hen as needed, tylenol ES one to two daily as needed long term  BUT WEIGHT loss and QUAD strengthening will make a difference  Fasting lipid in 5.month , 2 3weeks   Knee Osteoarthritis Treatment Decision Aid Introduction If you have osteoarthritis in your knees, you may have several treatment options. This document will review two of these options.  What are my treatment options?  Surgery  Non-surgical treatment may include one option or a combination of several of the following options:: Surgery for this condition is total knee replacement.  Exercise programs and working with a physical therapist.: This is a procedure to replace the knee joint with an artificial (prosthetic) knee joint.  Devices to help you move around (assistive devices), like a brace, wrap, splint, or wedge insoles.: It replaces parts of the thigh bone (femur), lower leg bone (tibia), and kneecap (patella) that are removed during the procedure.  Medicines.:  Shots of medicine to relieve pain and inflammation, such as steroids and hyaluronic acid.:  Using a small device which delivers mild pulses through to nerve endings to relieve pain (transcutaneous electrical stimulation, TENS).:  Applying heat and cold to the knee.:  Massage.:  Insertion of needles into certain places on the skin to relieve pain (acupuncture).:  A plan to control your weight or to lose weight, if you are overweight.: Who is eligible?  Good candidates may include:  Surgery  People who have pain that is tolerable.:  People who have pain that does not get better with non-surgical treatments and medicines.  People who are not allergic to medicines that are used.: People who have moderate or severe pain that makes it hard to do physical activities like walking, rising from a chair, or using stairs.  People who do not have an illness that makes medicines or injections risky.: People who have pain that affects quality of sleep.  People who are motivated to do exercise programs or lose weight.: People who do not have an illness that makes surgery risky.  People who do not have an infection, too much fluid, or skin breakdown in the knee area.: What are the benefits?  Benefits may include:  Surgery  Improved mobility and less pain.: Improved mobility and less pain.  Not needing surgery.: Pain likely being gone within 2 years after surgery.  Pain relief that lasts for 6-12 months, starting 1-2 days after steroid injections.:  Pain relief that lasts for several months, starting several weeks after hyaluronic acid injections.: What are the risks?  Risks may include:  Surgery  Failure to relieve symptoms.: Failure to relieve symptoms.  Joint damage that may continue to get worse.: Instability of the knee.  Medicine or injection side effects.: Damage to blood vessels, nerves, or other structures in the knee.  Bruising or bleeding with acupuncture therapy.: Bleeding or a blood clot.  Skin injury due to incorrect application of heat or cold therapy.: Infection.  Bruising due to massage.: Decreased range of motion of the knee.  Increased pain due to exercise programs.: Loosening of the  prosthetic joint.  : Needing knee replacement surgery again in 15-20 years to replace the prosthetic joint. What preparation is needed?  Preparation may include:  Surgery  Physical evaluation. This may involve lab work and imaging tests, such as X-rays, MRI, CT scan, or bone scans.: Physical evaluation. This may  involve lab work and imaging tests, such as X-rays, MRI, CT scan, or bone scans.  Planning to have someone take you home from the hospital or clinic, if you have knee injections.: Planning to have someone take you home from the hospital.  : Arranging for someone to help you for a few days after surgery.  : Working with specialists, if you have other medical conditions.  : Preparing your home for your recovery.  : Losing weight, if recommended by your provider.  : Not using any products that contain nicotine or tobacco, such as cigarettes and e-cigarettes. If you need help quitting, ask your health care provider.  : Doing exercises as directed by your provider.  : Having dental care and routine cleanings completed before your procedure. (You should not have dental work done for 3 months after your procedure.) The exact tests and frequency of office visits will be decided by your health care provider based on your individual condition. What are the restrictions after?  Restrictions may include:  Surgery  Needing to rest after pain-relief therapies.: Not taking baths, swimming, or using a hot tub until your provider approves.  Not driving, if you are taking medicines that make you sleepy.: Not playing contact sports until your provider approves.  Avoiding activities that require a lot of energy for a couple of days after knee injections.: Not driving or using heavy machinery until your provider approves. What can I expect from recovery?  You may experience:  Surgery  Tiredness, after therapies for pain relief.: Pain. Medicines and pain relief techniques will help make pain tolerable.  Needing to move your knee through its full range of motion after knee injections, to get all of the medicine into your knee joint.: Fluid draining from your incision through a tube (drain) for a couple of days.  Monitoring for a few hours after knee injections to make sure you do not have a reaction  to the medicine.: Limited range of motion in the knee that gradually improves over time. What is the impact to quality of life?  Impact to quality of life may include:  Surgery  Living with certain side effects of medicines.: Needing to stay in the hospital for a few days after your procedure.  Needing up to five injections over a period of five weeks, for certain kinds of hyaluronic acid injections.: Needing to use a walker for several weeks.  Limited physical activity, depending on your symptoms.: Limited physical activity for several weeks.  Impact to your daily schedule, including work, to make time for therapy and follow-up visits.: Impact to your daily schedule, including work, to make time for therapy and follow-up visits.  :  : The frequency of office visits, therapy, and injections will be decided by your health care provider and care team based on your individual condition. What is the cost?  Costs may include:  Surgery - $$$  Ongoing medicines.: Procedures and recovery.  Office visits.: Office visits.  Therapy visits.: Therapy visits.  Knee injection procedures.:  Assistive devices.: Exact costs will vary based on your location and insurance plan. Contact your insurance company to find out what is covered. Questions to ask your health care  provider:  Ask:  Surgery  Am I eligible for this option?: Am I eligible for this option?  What are my personal risks?: What are my personal risks?  How often will I need office or therapy visits?: How often will I need office or therapy visits?  What are the chances that I will need future treatment?: What are the chances that I will need another surgery?  : How effective is the surgery? Follow these instructions at home: Review these options and decide which one may be right for you.  My decision:  Non-Surgical Treatment  This is right for me:  This is not right for me:  I am unsure right  now:  Surgery  This is right for me:  This is not right for me:  I am unsure right now: This information is not intended to replace advice given to you by your health care provider. Make sure you discuss any questions you have with your health care provider. Document Released: 05/12/2016 Document Revised: 05/12/2016 Document Reviewed: 05/12/2016 Elsevier Interactive Patient Education  2017 [employer].

## 2016-09-21 ENCOUNTER — Encounter: Payer: Self-pay | Admitting: Family Medicine

## 2016-09-21 ENCOUNTER — Other Ambulatory Visit: Payer: Self-pay | Admitting: Family Medicine

## 2016-09-21 ENCOUNTER — Other Ambulatory Visit: Payer: Self-pay

## 2016-09-21 DIAGNOSIS — G43009 Migraine without aura, not intractable, without status migrainosus: Secondary | ICD-10-CM

## 2016-09-21 MED ORDER — SUMATRIPTAN SUCCINATE 100 MG PO TABS: ORAL_TABLET | ORAL | 0 refills | Status: DC

## 2016-09-23 DIAGNOSIS — M25562 Pain in left knee: Secondary | ICD-10-CM | POA: Insufficient documentation

## 2016-09-23 NOTE — Assessment & Plan Note (Signed)
Three month h/o increased pain , no trauma, weight loss , exercise and topical meds as needed, also tylenol as needed

## 2016-09-23 NOTE — Assessment & Plan Note (Signed)
Good response to immitrex, infrequent occurrence on avg max of twice  per month

## 2016-09-23 NOTE — Progress Notes (Signed)
Shawna Gonzales     MRN: [id number]      DOB: [birth year]   HPI Shawna Gonzales is here for follow up and re-evaluation of chronic medical conditions, medication management and review of any available recent lab and radiology data.  Preventive health is updated, specifically  Cancer screening and Immunization.   Questions or concerns regarding consultations or procedures which the PT has had in the interim are  addressed. The PT denies any adverse reactions to current medications since the last visit.  3 month h/o anterior knee pain esp when bent, no recent trauma , just weight gain. Concerned about her weight and intends to work on this with lifestyle change  ROS Denies recent fever or chills. Denies sinus pressure, c/o  nasal congestion, uses afrin daily, denies ear pain or sore throat. Denies chest congestion, productive cough or wheezing. Denies chest pains, palpitations and leg swelling Denies abdominal pain, nausea, vomiting,diarrhea or constipation.   Denies dysuria, frequency, hesitancy or incontinence.  Denies uncontrolled  headaches, reports good response to immitrex, deniesseizures, numbness, or tingling. Denies depression, anxiety or insomnia. Denies skin break down or rash.   PE  BP 102/68   Pulse 99   Resp 15   Ht 5\' 5"  (1.651 m)   Wt 238 lb (108 kg)   SpO2 96%   BMI 39.61 kg/m   Patient alert and oriented and in no cardiopulmonary distress.  HEENT: No facial asymmetry, EOMI,   oropharynx pink and moist.  Neck supple no JVD, no mass.  Chest: Clear to auscultation bilaterally.  CVS: S1, S2 no murmurs, no S3.Regular rate.  ABD: Soft non tender.   Ext: No edema  MS: Adequate ROM spine, shoulders, hips and reduced in left  Knee with anterior tenderness and crepitus.  Skin: Intact, no ulcerations or rash noted.  Psych: Good eye contact, normal affect. Memory intact not anxious or depressed appearing.  CNS: CN 2-12 intact, power,  normal throughout.no  focal deficits noted.   Assessment & Plan  Allergic rhinitis Uncontrolled, relies on daily afrin, discouraged this , she will commit to daily steroid nasal spray , if still experiencing excessive narrowing of nasal passage may need ENT exam  Left anterior knee pain Three month h/o increased pain , no trauma, weight loss , exercise and topical meds as needed, also tylenol as needed  Morbid obesity (HCC) Deteriorated. Patient re-educated about  the importance of commitment to a  minimum of 150 minutes of exercise per week.  The importance of healthy food choices with portion control discussed. Encouraged to start a food diary, count calories and to consider  joining a support group. Sample diet sheets offered. Goals set by the patient for the next several months.   Weight /BMI 09/20/2016 07/18/2016 06/04/2015  WEIGHT 238 lb 239 lb 9.6 oz 229 lb 12.8 oz  HEIGHT 5\' 5"  5\' 5"  5\' 5"   BMI 39.61 kg/m2 39.87 kg/m2 38.24 kg/m2      Migraine Good response to immitrex, infrequent occurrence on avg max of twice  per month  Impaired glucose tolerance Patient educated about the importance of limiting  Carbohydrate intake , the need to commit to daily physical activity for a minimum of 30 minutes , and to commit weight loss. The fact that changes in all these areas will reduce or eliminate all together the development of diabetes is stressed.  Currently corrected which is wonderful, pt applauded on this Diabetic Labs Latest Ref Rng & Units 07/11/2016 05/18/2015 04/08/2014 01/02/2014  02/19/2013  HbA1c <5.7 % 5.2 5.7(H) 5.6 5.5 -  Chol <200 mg/dL 409156 811156 914153 [id number]  HDL >50 mg/dL 21(H44(L) 08(M43(L) 43 57(Q36(L) 49  Calc LDL <100 mg/dL 86 91 85 75 42  Triglycerides <150 mg/dL 469129 629109 [id number] 413(K151(H) 440131  Creatinine 0.50 - 1.10 mg/dL 1.020.86 7.250.67 3.660.89 - 4.400.90   BP/Weight 09/20/2016 07/18/2016 06/04/2015 06/25/2014 05/19/2014 04/03/2014 03/19/2014  Systolic BP 102 132 120 129 142 120 118  Diastolic BP 68 84 78 76 85 82 62    Wt. (Lbs) 238 239.6 229.8 - - 246.4 248  BMI 39.61 39.87 38.24 - - 40.36 40.63   No flowsheet data found.    \

## 2016-09-23 NOTE — Assessment & Plan Note (Addendum)
Deteriorated.Ten pound weight loss goal in next 6 months Patient re-educated about  the importance of commitment to a  minimum of 150 minutes of exercise per week.  The importance of healthy food choices with portion control discussed. Encouraged to start a food diary, count calories and to consider  joining a support group. Sample diet sheets offered. Goals set by the patient for the next several months.   Weight /BMI 09/20/2016 07/18/2016 06/04/2015  WEIGHT 238 lb 239 lb 9.6 oz 229 lb 12.8 oz  HEIGHT 5\' 5"  5\' 5"  5\' 5"   BMI 39.61 kg/m2 39.87 kg/m2 38.24 kg/m2

## 2016-09-23 NOTE — Assessment & Plan Note (Signed)
Patient educated about the importance of limiting  Carbohydrate intake , the need to commit to daily physical activity for a minimum of 30 minutes , and to commit weight loss. The fact that changes in all these areas will reduce or eliminate all together the development of diabetes is stressed.  Currently corrected which is wonderful, pt applauded on this Diabetic Labs Latest Ref Rng & Units 07/11/2016 05/18/2015 04/08/2014 01/02/2014 02/19/2013  HbA1c <5.7 % 5.2 5.7(H) 5.6 5.5 -  Chol <200 mg/dL 409156 811156 914153 [id number]  HDL >50 mg/dL 21(H44(L) 08(M43(L) 43 57(Q36(L) 49  Calc LDL <100 mg/dL 86 91 85 75 42  Triglycerides <150 mg/dL 469129 629109 528124 413(K151(H) 440131  Creatinine 0.50 - 1.10 mg/dL 1.020.86 7.250.67 3.660.89 - 4.400.90   BP/Weight 09/20/2016 07/18/2016 06/04/2015 06/25/2014 05/19/2014 04/03/2014 03/19/2014  Systolic BP 102 132 120 129 142 120 118  Diastolic BP 68 84 78 76 85 82 62  Wt. (Lbs) 238 239.6 229.8 - - 246.4 248  BMI 39.61 39.87 38.24 - - 40.36 40.63   No flowsheet data found.

## 2016-10-07 ENCOUNTER — Encounter: Payer: Self-pay | Admitting: Family Medicine

## 2016-10-07 ENCOUNTER — Other Ambulatory Visit: Payer: Self-pay | Admitting: Family Medicine

## 2016-10-07 DIAGNOSIS — J358 Other chronic diseases of tonsils and adenoids: Secondary | ICD-10-CM

## 2016-10-07 DIAGNOSIS — R220 Localized swelling, mass and lump, head: Secondary | ICD-10-CM

## 2016-10-07 DIAGNOSIS — K146 Glossodynia: Secondary | ICD-10-CM

## 2016-11-01 ENCOUNTER — Encounter: Payer: Self-pay | Admitting: Family Medicine

## 2016-11-02 ENCOUNTER — Telehealth: Payer: Self-pay

## 2016-11-02 NOTE — Telephone Encounter (Signed)
error 

## 2016-12-30 MED FILL — FLUCONAZOLE 150 MG TABLET: 150 | 1 days supply | Qty: 1 | Fill #1

## 2017-01-03 ENCOUNTER — Other Ambulatory Visit: Payer: Self-pay | Admitting: Family Medicine

## 2017-01-03 ENCOUNTER — Encounter: Payer: Self-pay | Admitting: Family Medicine

## 2017-01-03 MED ORDER — PANTOPRAZOLE SODIUM 20 MG PO TBEC: 20.0000 mg | DELAYED_RELEASE_TABLET | Freq: Every day | ORAL | 1 refills | Status: DC

## 2017-01-22 ENCOUNTER — Encounter: Payer: Self-pay | Admitting: Family Medicine

## 2017-01-24 ENCOUNTER — Telehealth: Payer: Commercial Managed Care - PPO | Admitting: Family

## 2017-01-24 ENCOUNTER — Telehealth: Payer: Self-pay | Admitting: Family

## 2017-01-24 ENCOUNTER — Ambulatory Visit: Payer: BLUE CROSS/BLUE SHIELD | Admitting: Family Medicine

## 2017-01-24 DIAGNOSIS — J209 Acute bronchitis, unspecified: Secondary | ICD-10-CM

## 2017-01-24 MED ORDER — PREDNISONE 10 MG (21) PO TBPK: ORAL_TABLET | ORAL | 0 refills | Status: DC

## 2017-01-24 NOTE — Progress Notes (Signed)

## 2017-01-24 NOTE — Telephone Encounter (Signed)
Note for work

## 2017-01-31 DIAGNOSIS — Z01419 Encounter for gynecological examination (general) (routine) without abnormal findings: Secondary | ICD-10-CM | POA: Diagnosis not present

## 2017-03-09 DIAGNOSIS — J343 Hypertrophy of nasal turbinates: Secondary | ICD-10-CM | POA: Diagnosis not present

## 2017-03-09 DIAGNOSIS — J309 Allergic rhinitis, unspecified: Secondary | ICD-10-CM | POA: Diagnosis not present

## 2017-03-09 DIAGNOSIS — T485X5A Adverse effect of other anti-common-cold drugs, initial encounter: Secondary | ICD-10-CM | POA: Diagnosis not present

## 2017-03-09 DIAGNOSIS — J342 Deviated nasal septum: Secondary | ICD-10-CM | POA: Diagnosis not present

## 2017-03-20 ENCOUNTER — Ambulatory Visit: Payer: 59 | Admitting: Family Medicine

## 2017-04-21 DIAGNOSIS — Z118 Encounter for screening for other infectious and parasitic diseases: Secondary | ICD-10-CM | POA: Diagnosis not present

## 2017-04-21 DIAGNOSIS — N760 Acute vaginitis: Secondary | ICD-10-CM | POA: Diagnosis not present

## 2017-04-21 DIAGNOSIS — B373 Candidiasis of vulva and vagina: Secondary | ICD-10-CM | POA: Diagnosis not present

## 2017-04-22 ENCOUNTER — Telehealth: Payer: Commercial Managed Care - PPO | Admitting: Family

## 2017-04-22 DIAGNOSIS — J029 Acute pharyngitis, unspecified: Secondary | ICD-10-CM

## 2017-04-22 MED ORDER — PREDNISONE 5 MG PO TABS: 5.0000 mg | ORAL_TABLET | ORAL | 0 refills | Status: DC

## 2017-04-22 NOTE — Progress Notes (Signed)
Thank you for the details you included in the comment boxes. Those details are very helpful in determining the best course of treatment for you and help us to provide the best care. The plan below is per our telephone conversation.  We are sorry that you are not feeling well.  Here is how we plan to help!  Based on your presentation I believe you most likely have A cough due to a virus.  This is called viral bronchitis and is best treated by rest, plenty of fluids and control of the cough.  You may use Ibuprofen or Tylenol as directed to help your symptoms.     In addition you may use   Sterapred 5 mg dosepak  From your responses in the eVisit questionnaire you describe inflammation in the upper respiratory tract which is causing a significant cough.  This is commonly called Bronchitis and has four common causes:    Allergies  Viral Infections  Acid Reflux  Bacterial Infection Allergies, viruses and acid reflux are treated by controlling symptoms or eliminating the cause. An example might be a cough caused by taking certain blood pressure medications. You stop the cough by changing the medication. Another example might be a cough caused by acid reflux. Controlling the reflux helps control the cough.  USE OF BRONCHODILATOR ("RESCUE") INHALERS: There is a risk from using your bronchodilator too frequently.  The risk is that over-reliance on a medication which only relaxes the muscles surrounding the breathing tubes can reduce the effectiveness of medications prescribed to reduce swelling and congestion of the tubes themselves.  Although you feel brief relief from the bronchodilator inhaler, your asthma may actually be worsening with the tubes becoming more swollen and filled with mucus.  This can delay other crucial treatments, such as oral steroid medications. If you need to use a bronchodilator inhaler daily, several times per day, you should discuss this with your provider.  There are probably  better treatments that could be used to keep your asthma under control.     HOME CARE . Only take medications as instructed by your medical team. . Complete the entire course of an antibiotic. . Drink plenty of fluids and get plenty of rest. . Avoid close contacts especially the very young and the elderly . Cover your mouth if you cough or cough into your sleeve. . Always remember to wash your hands . A steam or ultrasonic humidifier can help congestion.   GET HELP RIGHT AWAY IF: . You develop worsening fever. . You become short of breath . You cough up blood. . Your symptoms persist after you have completed your treatment plan MAKE SURE YOU   Understand these instructions.  Will watch your condition.  Will get help right away if you are not doing well or get worse.  Your e-visit answers were reviewed by a board certified advanced clinical practitioner to complete your personal care plan.  Depending on the condition, your plan could have included both over the counter or prescription medications. If there is a problem please reply  once you have received a response from your provider. Your safety is important to us.  If you have drug allergies check your prescription carefully.    You can use MyChart to ask questions about today's visit, request a non-urgent call back, or ask for a work or school excuse for 24 hours related to this e-Visit. If it has been greater than 24 hours you will need to follow up with your  or enter a new e-Visit to address those concerns. You will get an e-mail in the next two days asking about your experience.  I hope that your e-visit has been valuable and will speed your recovery. Thank you for using e-visits.   

## 2017-06-03 ENCOUNTER — Encounter: Payer: Self-pay | Admitting: Family Medicine

## 2017-06-05 ENCOUNTER — Other Ambulatory Visit: Payer: Self-pay

## 2017-06-05 MED ORDER — IBUPROFEN 800 MG PO TABS: 800.0000 mg | ORAL_TABLET | Freq: Three times a day (TID) | ORAL | 0 refills | Status: DC | PRN

## 2017-07-02 ENCOUNTER — Other Ambulatory Visit: Payer: Self-pay | Admitting: Family Medicine

## 2017-07-02 DIAGNOSIS — G43009 Migraine without aura, not intractable, without status migrainosus: Secondary | ICD-10-CM

## 2017-09-14 ENCOUNTER — Encounter: Payer: Self-pay | Admitting: Family Medicine

## 2017-09-15 ENCOUNTER — Telehealth: Payer: Self-pay

## 2017-09-15 DIAGNOSIS — E785 Hyperlipidemia, unspecified: Secondary | ICD-10-CM

## 2017-09-15 DIAGNOSIS — R7302 Impaired glucose tolerance (oral): Secondary | ICD-10-CM

## 2017-09-15 NOTE — Telephone Encounter (Signed)
Labs ordered.

## 2017-09-25 ENCOUNTER — Encounter: Payer: Self-pay | Admitting: Family Medicine

## 2017-10-13 ENCOUNTER — Other Ambulatory Visit: Payer: Self-pay | Admitting: Family Medicine

## 2017-10-13 ENCOUNTER — Encounter: Payer: Self-pay | Admitting: Family Medicine

## 2017-10-13 MED ORDER — HYDROCODONE-ACETAMINOPHEN 7.5-325 MG PO TABS: 1.0000 | ORAL_TABLET | Freq: Four times a day (QID) | ORAL | 0 refills | Status: DC | PRN

## 2017-10-13 MED ORDER — ONDANSETRON HCL 4 MG PO TABS: 4.0000 mg | ORAL_TABLET | Freq: Three times a day (TID) | ORAL | 0 refills | Status: DC | PRN

## 2017-10-13 NOTE — Progress Notes (Signed)
Patient called with migraine.  Not responding to imitrex.  Called in zofran and hydrocodone (#6).  Take each and rest.  YSN

## 2017-10-15 ENCOUNTER — Encounter (HOSPITAL_COMMUNITY): Payer: Self-pay | Admitting: *Deleted

## 2017-10-15 ENCOUNTER — Ambulatory Visit (HOSPITAL_COMMUNITY)
Admission: EM | Admit: 2017-10-15 | Discharge: 2017-10-15 | Disposition: A | Discharge status: Home / Self Care | Payer: BLUE CROSS/BLUE SHIELD | Attending: Urgent Care | Admitting: Urgent Care

## 2017-10-15 ENCOUNTER — Other Ambulatory Visit: Payer: Self-pay

## 2017-10-15 DIAGNOSIS — R638 Other symptoms and signs concerning food and fluid intake: Secondary | ICD-10-CM

## 2017-10-15 DIAGNOSIS — H53149 Visual discomfort, unspecified: Secondary | ICD-10-CM

## 2017-10-15 DIAGNOSIS — R112 Nausea with vomiting, unspecified: Secondary | ICD-10-CM

## 2017-10-15 DIAGNOSIS — R630 Anorexia: Secondary | ICD-10-CM

## 2017-10-15 DIAGNOSIS — G43809 Other migraine, not intractable, without status migrainosus: Secondary | ICD-10-CM | POA: Diagnosis not present

## 2017-10-15 DIAGNOSIS — G43009 Migraine without aura, not intractable, without status migrainosus: Secondary | ICD-10-CM

## 2017-10-15 MED ORDER — SUMATRIPTAN SUCCINATE 100 MG PO TABS: ORAL_TABLET | ORAL | 0 refills | Status: DC

## 2017-10-15 MED ORDER — KETOROLAC TROMETHAMINE 60 MG/2ML IM SOLN
60.0000 mg | Freq: Once | INTRAMUSCULAR | Status: AC
  Administered 2017-10-15: 60 mg via INTRAMUSCULAR

## 2017-10-15 MED ORDER — KETOROLAC TROMETHAMINE 60 MG/2ML IM SOLN
INTRAMUSCULAR | Status: AC
  Filled 2017-10-15: qty 2

## 2017-10-15 NOTE — ED Triage Notes (Signed)
Migraine since Thursday, per pt her med is not working, Per pt it's causing her entire body to ache, vomiting, loss of appetite, chills,

## 2017-10-15 NOTE — ED Provider Notes (Signed)
MRN: [id number] DOB: May 02, 1981  Subjective:   Shawna Gonzales is a 37 y.o. female presenting for 4-day history of persistent migraine.  Patient reports that her headache is frontal and extends to the back of her head, has associated bilateral eye pressure, photophobia, nausea with vomiting.  She has a history of migraines, takes Imitrex for this but has not held for this particular episode and now she is out of her Imitrex.  She contacted her PCP and was prescribed hydrocodone which has provided some temporary relief only.  Patient denies confusion, dizziness, fever, sinus congestion, ear pain, sore throat, cough, weakness, numbness or tingling.  Denies smoking cigarettes.  Admits that she has not been able to hydrate very well due to her nausea and vomiting.  She has a prescription for Zofran but has only used this once.  Drinks 1 cup of coffee per day.  No current facility-administered medications for this encounter.   Current Outpatient Medications:  .  acetaminophen (TYLENOL GO TABS EXTRA STRENGTH) 500 MG chewable tablet, Chew 1 tablet (500 mg total) by mouth every 6 (six) hours as needed for pain., Disp: 30 tablet, Rfl: 0 .  fluticasone (FLONASE) 50 MCG/ACT nasal spray, Place 2 sprays into both nostrils daily., Disp: 16 g, Rfl: 2 .  HYDROcodone-acetaminophen (NORCO) 7.5-325 MG tablet, Take 1 tablet by mouth every 6 (six) hours as needed for moderate pain., Disp: 6 tablet, Rfl: 0 .  ibuprofen (ADVIL,MOTRIN) 800 MG tablet, Take 1 tablet (800 mg total) by mouth every 8 (eight) hours as needed., Disp: 30 tablet, Rfl: 0 .  ondansetron (ZOFRAN) 4 MG tablet, Take 1 tablet (4 mg total) by mouth every 8 (eight) hours as needed for nausea or vomiting., Disp: 20 tablet, Rfl: 0 .  pantoprazole (PROTONIX) 20 MG tablet, Take 1 tablet (20 mg total) by mouth daily., Disp: 30 tablet, Rfl: 1 .  SUMAtriptan (IMITREX) 100 MG tablet, TAKE 1 TABLET BY MOUTH MAY REPEAT IN 2HRS IF PERSISTS MAX 2 A DAY USE TWICE  WEEKLY, Disp: 9 tablet, Rfl: 0   Allergies  Allergen Reactions  . Acyclovir And Related     Past Medical History:  Diagnosis Date  . Contraceptive management   . GERD (gastroesophageal reflux disease)   . High cholesterol   . Macromastia   . Obesity      Past Surgical History:  Procedure Laterality Date  . BREAST SURGERY    . WISDOM TOOTH EXTRACTION      Objective:   Vitals: BP 127/73 (BP Location: Left Arm)   Pulse 89   Temp 98.5 F (36.9 C) (Oral)   SpO2 100%   Physical Exam  Constitutional: She is oriented to person, place, and time. She appears well-developed and well-nourished.  HENT:  Mouth/Throat: Oropharynx is clear and moist.  Eyes: Pupils are equal, round, and reactive to light. EOM are normal. Right eye exhibits no discharge. Left eye exhibits no discharge. No scleral icterus.  Neck: Normal range of motion. Neck supple.  Cardiovascular: Normal rate.  Pulmonary/Chest: Effort normal.  Lymphadenopathy:    She has no cervical adenopathy.  Neurological: She is alert and oriented to person, place, and time. She displays normal reflexes. No cranial nerve deficit. Coordination normal.  Skin: Skin is warm and dry.  Psychiatric: She has a normal mood and affect.   Assessment and Plan :   Other migraine without status migrainosus, not intractable  Nausea and vomiting, intractability of vomiting not specified, unspecified vomiting type  Migraine without aura  and without status migrainosus, not intractable - Plan: SUMAtriptan (IMITREX) 100 MG tablet  Decreased appetite  Photophobia  Counseled patient on management of headaches.  We will try Toradol in clinic.  I refilled her Imitrex and recommended she schedule her Zofran to help with nausea/vomiting and avoid dehydration.  Patient has an appointment with her PCP manages her headaches this upcoming Friday.   Wallis BambergMani, Lisandro Meggett, PA-C 10/15/17 1447

## 2017-10-16 ENCOUNTER — Telehealth: Payer: Self-pay | Admitting: Family Medicine

## 2017-10-16 NOTE — Telephone Encounter (Signed)
Please call and offer appointment with soonest available Provider,for pt to be evaluated for neck pain and headache. I received an e message requesting an order for an MRI, I have not seen  her since 09/2016 , she needs an oV  ??/ problems with this Please ask/ let me know

## 2017-10-17 ENCOUNTER — Ambulatory Visit: Payer: BLUE CROSS/BLUE SHIELD | Admitting: Family Medicine

## 2017-10-17 NOTE — Telephone Encounter (Signed)
I called the pt to advise that we could see her, and she stated that she would wait til the 18th to see Dr Lodema HongSimpson. I advised her if anything changed to please call us

## 2017-10-19 ENCOUNTER — Ambulatory Visit (INDEPENDENT_AMBULATORY_CARE_PROVIDER_SITE_OTHER): Payer: BLUE CROSS/BLUE SHIELD | Admitting: Family Medicine

## 2017-10-19 ENCOUNTER — Encounter: Payer: Self-pay | Admitting: Family Medicine

## 2017-10-19 VITALS — BP 108/80 | HR 87 | Resp 16 | Ht 65.0 in | Wt 233.0 lb

## 2017-10-19 DIAGNOSIS — E785 Hyperlipidemia, unspecified: Secondary | ICD-10-CM | POA: Diagnosis not present

## 2017-10-19 DIAGNOSIS — G4452 New daily persistent headache (NDPH): Secondary | ICD-10-CM | POA: Diagnosis not present

## 2017-10-19 DIAGNOSIS — J3089 Other allergic rhinitis: Secondary | ICD-10-CM

## 2017-10-19 DIAGNOSIS — R7302 Impaired glucose tolerance (oral): Secondary | ICD-10-CM | POA: Diagnosis not present

## 2017-10-19 MED ORDER — PREDNISONE 5 MG PO TABS: ORAL_TABLET | ORAL | 0 refills | Status: DC

## 2017-10-19 MED ORDER — MONTELUKAST SODIUM 10 MG PO TABS: 10.0000 mg | ORAL_TABLET | Freq: Every day | ORAL | 3 refills | Status: DC

## 2017-10-19 MED ORDER — FLUTICASONE PROPIONATE 50 MCG/ACT NA SUSP: 1.0000 | Freq: Every day | NASAL | 3 refills | Status: DC

## 2017-10-19 MED ORDER — TIZANIDINE HCL 4 MG PO CAPS: ORAL_CAPSULE | ORAL | 0 refills | Status: DC

## 2017-10-19 MED ORDER — IBUPROFEN 600 MG PO TABS: ORAL_TABLET | ORAL | 0 refills | Status: DC

## 2017-10-19 NOTE — Progress Notes (Signed)
   Shawna ConnersVictoria A Gonzales     MRN: [id number]      DOB: 12/13/1980   HPI [name] is here with a main concern w of as 1 week h/o severe generalized headache, more on the left than right, awoke pt   Last Thursday , rated at a 10 plus then throbbing with nausea , and emesis, no response really to 2 immitrex, had vicodin prescribed, no relief, went to urgent care , some relief with toradol, still present 1 week later ratesd at  A 5 , needs to minimize stimuli. Was unable to work from last Thursday to Monday, was in ED last Sunday.  Denies ever having any weakness, numbness, blurred vision, difficulty with speech. Took 3 of 6 vicodin , last taken was 6 days ago, just feels as though she is in a fog  No fever , has noted chills since past Thursday to Sunday For the op paest  1 year she averages  ROS Denies recent fever or chills. Denies sinus pressure, nasal congestion, ear pain or sore throat. Denies chest congestion, productive cough or wheezing. Denies chest pains, palpitations and leg swelling Denies abdominal pain, nausea, vomiting,diarrhea or constipation.   Denies dysuria, frequency, hesitancy or incontinence. Denies joint pain, swelling and limitation in mobility. Denies headaches, seizures, numbness, or tingling. Denies depression, anxiety or insomnia. Denies skin break down or rash. Also wants to work on weight loss in a consistent fashion, has a h/o stop and start with no follow through   PE  BP 108/80   Pulse 87   Resp 16   Ht 5\' 5"  (1.651 m)   Wt 233 lb (105.7 kg)   SpO2 97%   BMI 38.77 kg/m   Patient alert and oriented and in no cardiopulmonary distress.  HEENT: No facial asymmetry, EOMI,   oropharynx pink and moist.  Neck supple no JVD, no mass.Nasal mucosa erythematous and edematous, TM clear, no sinus tenderness  Chest: Clear to auscultation bilaterally.  CVS: S1, S2 no murmurs, no S3.Regular rate.  ABD: Soft non tender.   Ext: No edema  MS: Adequate ROM  spine, shoulders, hips and knees.  Skin: Intact, no ulcerations or rash noted.  Psych: Good eye contact, normal affect. Memory intact not anxious or depressed appearing.  CNS: CN 2-12 intact, power,  normal throughout.no focal deficits noted.   Assessment & Plan  Headache 1 week h/o new daily disabling headache, no significant response to medications previously used for migraines. Was diagnosed with migraine headaches at approximately age 37, has never had imaging of her brain. Most recent headache has been unusual in intensity and duration . Head CT scan ordered   Morbid obesity (HCC) 5 pound weight loss in 1 year. Patient re-educated about  the importance of commitment to a  minimum of 150 minutes of exercise per week.  The importance of healthy food choices with portion control discussed. Encouraged to start a food diary, count calories and to consider  joining a support group. Sample diet sheets offered. Goals set by the patient for the next several months.   Weight /BMI 10/19/2017 09/20/2016 07/18/2016  WEIGHT 233 lb 238 lb 239 lb 9.6 oz  HEIGHT 5\' 5"  5\' 5"  5\' 5"   BMI 38.77 kg/m2 39.61 kg/m2 39.87 kg/m2      Allergic rhinitis Uncontrolled. Educated for 5 minutes re pathology and appropriate medication use. Chronic medications are prescribed. I also explained that uncontroled allergies often contribute to headache

## 2017-10-19 NOTE — Patient Instructions (Addendum)
F/u in 4.5 months, call if you need me sooner  You are being referred for brain scan due to new disabling headache we will send message with appointment info  Please commit to DAILY allergy medication  Please have labs today  Muscle relaxant sent in for left neck spasm   Start migraine journal and eliminate as able triggers, obvious once may be foods, stress, and poor sleep  7 to 8 hours  Sleep daily important  It is important that you exercise regularly at least 30 minutes 6 times a week. If you develop chest pain, have severe difficulty breathing, or feel very tired, stop exercising immediately and seek medical attention   Water 64 ounces daily  Stop eating by 7: 30 at night   Commit to 3 meals daily, snack with fresh fruit and veg, and avoid all processed and canned foods as much as possible    Migraine Headache A migraine headache is a very strong throbbing pain on one side or both sides of your head. Migraines can also cause other symptoms. Talk with your doctor about what things may bring on (trigger) your migraine headaches. Follow these instructions at home: Medicines  Take over-the-counter and prescription medicines only as told by your doctor.  Do not drive or use heavy machinery while taking prescription pain medicine.  To prevent or treat constipation while you are taking prescription pain medicine, your doctor may recommend that you: ? Drink enough fluid to keep your pee (urine) clear or pale yellow. ? Take over-the-counter or prescription medicines. ? Eat foods that are high in fiber. These include fresh fruits and vegetables, whole grains, and beans. ? Limit foods that are high in fat and processed sugars. These include fried and sweet foods. Lifestyle  Avoid alcohol.  Do not use any products that contain nicotine or tobacco, such as cigarettes and e-cigarettes. If you need help quitting, ask your doctor.  Get at least 8 hours of sleep every night.  Limit  your stress. General instructions   Keep a journal to find out what may bring on your migraines. For example, write down: ? What you eat and drink. ? How much sleep you get. ? Any change in what you eat or drink. ? Any change in your medicines.  If you have a migraine: ? Avoid things that make your symptoms worse, such as bright lights. ? It may help to lie down in a dark, quiet room. ? Do not drive or use heavy machinery. ? Ask your doctor what activities are safe for you.  Keep all follow-up visits as told by your doctor. This is important. Contact a doctor if:  You get a migraine that is different or worse than your usual migraines. Get help right away if:  Your migraine gets very bad.  You have a fever.  You have a stiff neck.  You have trouble seeing.  Your muscles feel weak or like you cannot control them.  You start to lose your balance a lot.  You start to have trouble walking.  You pass out (faint). This information is not intended to replace advice given to you by your health care provider. Make sure you discuss any questions you have with your health care provider. Document Released: 03/29/2008 Document Revised: 01/08/2016 Document Reviewed: 12/07/2015 Elsevier Interactive Patient Education  2018 [employer]

## 2017-10-20 ENCOUNTER — Encounter: Payer: Self-pay | Admitting: Family Medicine

## 2017-10-20 LAB — LIPID PANEL
CHOL/HDL RATIO: 3.1 (calc) (ref <5.0)
Cholesterol: 135 mg/dL (ref <200)
HDL: 44 mg/dL — AB (ref >50)
LDL Cholesterol (Calc): 71 mg/dL (calc)
NON-HDL CHOLESTEROL (CALC): 91 mg/dL (ref <130)
Triglycerides: 117 mg/dL (ref <150)

## 2017-10-20 LAB — BASIC METABOLIC PANEL
BUN: 9 mg/dL (ref 7–25)
CHLORIDE: 103 mmol/L (ref 98–110)
CO2: 30 mmol/L (ref 20–32)
CREATININE: 0.81 mg/dL (ref 0.50–1.10)
Calcium: 9.6 mg/dL (ref 8.6–10.2)
Glucose, Bld: 93 mg/dL (ref 65–139)
POTASSIUM: 4.3 mmol/L (ref 3.5–5.3)
SODIUM: 138 mmol/L (ref 135–146)

## 2017-10-20 LAB — CBC
HCT: 37.8 % (ref 35.0–45.0)
Hemoglobin: 12.8 g/dL (ref 11.7–15.5)
MCH: 27.4 pg (ref 27.0–33.0)
MCHC: 33.9 g/dL (ref 32.0–36.0)
MCV: 80.8 fL (ref 80.0–100.0)
MPV: 11.1 fL (ref 7.5–12.5)
PLATELETS: 400 10*3/uL (ref 140–400)
RBC: 4.68 10*6/uL (ref 3.80–5.10)
RDW: 12.7 % (ref 11.0–15.0)
WBC: 10 10*3/uL (ref 3.8–10.8)

## 2017-10-20 LAB — HEMOGLOBIN A1C
HEMOGLOBIN A1C: 5.4 %{Hb} (ref <5.7)
MEAN PLASMA GLUCOSE: 108 (calc)
eAG (mmol/L): 6 (calc)

## 2017-10-20 LAB — TSH: TSH: 1.41 m[IU]/L

## 2017-10-31 DIAGNOSIS — R51 Headache: Secondary | ICD-10-CM

## 2017-10-31 DIAGNOSIS — R519 Headache, unspecified: Secondary | ICD-10-CM | POA: Insufficient documentation

## 2017-10-31 NOTE — Assessment & Plan Note (Signed)
Uncontrolled. Educated for 5 minutes re pathology and appropriate medication use. Chronic medications are prescribed. I also explained that uncontroled allergies often contribute to headache

## 2017-10-31 NOTE — Assessment & Plan Note (Signed)
5 pound weight loss in 1 year. Patient re-educated about  the importance of commitment to a  minimum of 150 minutes of exercise per week.  The importance of healthy food choices with portion control discussed. Encouraged to start a food diary, count calories and to consider  joining a support group. Sample diet sheets offered. Goals set by the patient for the next several months.   Weight /BMI 10/19/2017 09/20/2016 07/18/2016  WEIGHT 233 lb 238 lb 239 lb 9.6 oz  HEIGHT     BMI 38.77 kg/m2 39.61 kg/m2 39.87 kg/m2

## 2017-10-31 NOTE — Assessment & Plan Note (Signed)
1 week h/o new daily disabling headache, no significant response to medications previously used for migraines. Was diagnosed with migraine headaches at approximately age 37, has never had imaging of her brain. Most recent headache has been unusual in intensity and duration . Head CT scan ordered

## 2017-12-18 ENCOUNTER — Ambulatory Visit (HOSPITAL_COMMUNITY): Payer: BLUE CROSS/BLUE SHIELD

## 2017-12-21 ENCOUNTER — Telehealth: Payer: BLUE CROSS/BLUE SHIELD | Admitting: Family

## 2017-12-21 DIAGNOSIS — J029 Acute pharyngitis, unspecified: Secondary | ICD-10-CM

## 2017-12-21 MED ORDER — BENZONATATE 100 MG PO CAPS: 100.0000 mg | ORAL_CAPSULE | Freq: Three times a day (TID) | ORAL | 0 refills | Status: DC | PRN

## 2017-12-21 MED ORDER — FLUTICASONE PROPIONATE 50 MCG/ACT NA SUSP: 2.0000 | Freq: Every day | NASAL | 6 refills | Status: DC

## 2017-12-21 NOTE — Progress Notes (Signed)
Thank you for the details you included in the comment boxes. Those details are very helpful in determining the best course of treatment for you and help us to provide the best care.  We are sorry that you are not feeling well.  Here is how we plan to help!  Based on your presentation I believe you most likely have A cough due to reflux. To lessen this cough you can use the over-the counter cough medication Delsym but it is very important that we control the cause of the cough.  I suggest that you begin Prilosec 20 mg twice a day for 2 weeks.  You should see the cough lessen quickly as your reflux ( which may be silent or without burning ) is treated.    In addition you may use A non-prescription cough medication called Mucinex DM: take 2 tablets every 12 hours. and A prescription cough medication called Tessalon Perles 100mg . You may take 1-2 capsules every 8 hours as needed for your cough. I have also sent flonase which you may use 2 sprays in each nare one daily.   From your responses in the eVisit questionnaire you describe inflammation in the upper respiratory tract which is causing a significant cough.  This is commonly called Bronchitis and has four common causes:    Allergies  Viral Infections  Acid Reflux  Bacterial Infection Allergies, viruses and acid reflux are treated by controlling symptoms or eliminating the cause. An example might be a cough caused by taking certain blood pressure medications. You stop the cough by changing the medication. Another example might be a cough caused by acid reflux. Controlling the reflux helps control the cough.  USE OF BRONCHODILATOR ("RESCUE") INHALERS: There is a risk from using your bronchodilator too frequently.  The risk is that over-reliance on a medication which only relaxes the muscles surrounding the breathing tubes can reduce the effectiveness of medications prescribed to reduce swelling and congestion of the tubes themselves.  Although you  feel brief relief from the bronchodilator inhaler, your asthma may actually be worsening with the tubes becoming more swollen and filled with mucus.  This can delay other crucial treatments, such as oral steroid medications. If you need to use a bronchodilator inhaler daily, several times per day, you should discuss this with your provider.  There are probably better treatments that could be used to keep your asthma under control.     HOME CARE . Only take medications as instructed by your medical team. . Complete the entire course of an antibiotic. . Drink plenty of fluids and get plenty of rest. . Avoid close contacts especially the very young and the elderly . Cover your mouth if you cough or cough into your sleeve. . Always remember to wash your hands . A steam or ultrasonic humidifier can help congestion.   GET HELP RIGHT AWAY IF: . You develop worsening fever. . You become short of breath . You cough up blood. . Your symptoms persist after you have completed your treatment plan MAKE SURE YOU   Understand these instructions.  Will watch your condition.  Will get help right away if you are not doing well or get worse.  Your e-visit answers were reviewed by a board certified advanced clinical practitioner to complete your personal care plan.  Depending on the condition, your plan could have included both over the counter or prescription medications. If there is a problem please reply  once you have received a response from your  provider. Your safety is important to [place].  If you have drug allergies check your prescription carefully.    You can use MyChart to ask questions about today's visit, request a non-urgent call back, or ask for a work or school excuse for 24 hours related to this e-Visit. If it has been greater than 24 hours you will need to follow up with your provider, or enter a new e-Visit to address those concerns. You will get an e-mail in the next two days asking about  your experience.  I hope that your e-visit has been valuable and will speed your recovery. Thank you for using e-visits.

## 2017-12-25 ENCOUNTER — Ambulatory Visit (HOSPITAL_COMMUNITY): Payer: BLUE CROSS/BLUE SHIELD | Attending: Family Medicine

## 2018-01-12 ENCOUNTER — Encounter: Payer: Self-pay | Admitting: Family Medicine

## 2018-01-13 ENCOUNTER — Other Ambulatory Visit: Payer: Self-pay | Admitting: Family Medicine

## 2018-01-13 MED ORDER — CYCLOBENZAPRINE HCL 5 MG PO TABS: ORAL_TABLET | ORAL | 0 refills | Status: DC

## 2018-01-29 ENCOUNTER — Ambulatory Visit: Payer: BLUE CROSS/BLUE SHIELD | Admitting: Family Medicine

## 2018-02-01 ENCOUNTER — Other Ambulatory Visit: Payer: Self-pay | Admitting: Family Medicine

## 2018-02-01 ENCOUNTER — Encounter: Payer: Self-pay | Admitting: Family Medicine

## 2018-02-13 DIAGNOSIS — Z6841 Body Mass Index (BMI) 40.0 and over, adult: Secondary | ICD-10-CM | POA: Diagnosis not present

## 2018-02-13 DIAGNOSIS — B977 Papillomavirus as the cause of diseases classified elsewhere: Secondary | ICD-10-CM | POA: Diagnosis not present

## 2018-02-13 DIAGNOSIS — Z113 Encounter for screening for infections with a predominantly sexual mode of transmission: Secondary | ICD-10-CM | POA: Diagnosis not present

## 2018-02-13 DIAGNOSIS — Z1151 Encounter for screening for human papillomavirus (HPV): Secondary | ICD-10-CM | POA: Diagnosis not present

## 2018-02-13 DIAGNOSIS — Z124 Encounter for screening for malignant neoplasm of cervix: Secondary | ICD-10-CM | POA: Diagnosis not present

## 2018-02-13 DIAGNOSIS — Z01419 Encounter for gynecological examination (general) (routine) without abnormal findings: Secondary | ICD-10-CM | POA: Diagnosis not present

## 2018-02-21 ENCOUNTER — Ambulatory Visit: Payer: BLUE CROSS/BLUE SHIELD | Admitting: Family Medicine

## 2018-03-05 ENCOUNTER — Other Ambulatory Visit: Payer: Self-pay | Admitting: Family Medicine

## 2018-03-07 DIAGNOSIS — B977 Papillomavirus as the cause of diseases classified elsewhere: Secondary | ICD-10-CM | POA: Diagnosis not present

## 2018-03-07 DIAGNOSIS — N72 Inflammatory disease of cervix uteri: Secondary | ICD-10-CM | POA: Diagnosis not present

## 2018-04-30 ENCOUNTER — Encounter: Payer: Self-pay | Admitting: Family Medicine

## 2018-05-09 ENCOUNTER — Other Ambulatory Visit: Payer: Self-pay | Admitting: Urgent Care

## 2018-05-09 DIAGNOSIS — G43009 Migraine without aura, not intractable, without status migrainosus: Secondary | ICD-10-CM

## 2018-06-30 ENCOUNTER — Other Ambulatory Visit: Payer: Self-pay | Admitting: Family Medicine

## 2018-07-11 ENCOUNTER — Other Ambulatory Visit: Payer: Self-pay | Admitting: Family Medicine

## 2018-07-12 ENCOUNTER — Other Ambulatory Visit: Payer: Self-pay | Admitting: Family Medicine

## 2018-07-23 ENCOUNTER — Other Ambulatory Visit: Payer: Self-pay

## 2018-07-23 MED ORDER — IBUPROFEN 600 MG PO TABS: ORAL_TABLET | ORAL | 0 refills | Status: DC

## 2018-07-24 ENCOUNTER — Other Ambulatory Visit: Payer: Self-pay | Admitting: Family Medicine

## 2018-07-25 ENCOUNTER — Other Ambulatory Visit: Payer: Self-pay | Admitting: Family Medicine

## 2018-07-26 ENCOUNTER — Other Ambulatory Visit: Payer: Self-pay

## 2018-07-26 MED ORDER — PANTOPRAZOLE SODIUM 20 MG PO TBEC: 20.0000 mg | DELAYED_RELEASE_TABLET | Freq: Every day | ORAL | 1 refills | Status: DC

## 2018-07-30 ENCOUNTER — Encounter: Payer: Self-pay | Admitting: Family Medicine

## 2018-07-30 ENCOUNTER — Other Ambulatory Visit: Payer: Self-pay | Admitting: Family Medicine

## 2018-07-30 MED ORDER — HYDROCORTISONE ACE-PRAMOXINE 1-1 % RE FOAM: 1.0000 | Freq: Two times a day (BID) | RECTAL | 0 refills | Status: DC

## 2018-08-10 ENCOUNTER — Ambulatory Visit (HOSPITAL_COMMUNITY)
Admission: EM | Admit: 2018-08-10 | Discharge: 2018-08-10 | Disposition: A | Discharge status: Home / Self Care | Payer: BLUE CROSS/BLUE SHIELD | Attending: Family Medicine | Admitting: Family Medicine

## 2018-08-10 ENCOUNTER — Ambulatory Visit (INDEPENDENT_AMBULATORY_CARE_PROVIDER_SITE_OTHER): Payer: BLUE CROSS/BLUE SHIELD

## 2018-08-10 ENCOUNTER — Encounter (HOSPITAL_COMMUNITY): Payer: Self-pay | Admitting: Emergency Medicine

## 2018-08-10 DIAGNOSIS — M25561 Pain in right knee: Secondary | ICD-10-CM

## 2018-08-10 DIAGNOSIS — M1711 Unilateral primary osteoarthritis, right knee: Secondary | ICD-10-CM | POA: Diagnosis not present

## 2018-08-10 DIAGNOSIS — M7121 Synovial cyst of popliteal space [Baker], right knee: Secondary | ICD-10-CM

## 2018-08-10 MED ORDER — METHYLPREDNISOLONE 4 MG PO TBPK: ORAL_TABLET | ORAL | 0 refills | Status: DC

## 2018-08-10 NOTE — ED Provider Notes (Signed)
[facility]    CSN: [id number] Arrival date & time: 08/10/18  [id number]     History   Chief Complaint Chief Complaint  Patient presents with  . Knee Pain    HPI Shawna Gonzales is a 38 y.o. female.   HPI  Gradual onset of knee pain over 2 weeks.  Now she is walking with a limp.  Ibuprofen 800 mg is not helping.  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  No accident, no injury.  She states arthritis runs in her family.  She is on her feet a lot.  She does have arthritis in her left knee, found on x-rays in 2014 after a fall.  Been taking ibuprofen 800 mg with no improvement.  Was unable to work today because of pain.  Past Medical History:  Diagnosis Date  . Contraceptive management   . GERD (gastroesophageal reflux disease)   . High cholesterol   . Macromastia   . Obesity     Patient Active Problem List   Diagnosis Date Noted  . Headache 10/31/2017  . Left anterior knee pain 09/23/2016  . Gall bladder polyp 06/04/2015  . GERD (gastroesophageal reflux disease) 04/03/2014  . Migraine 04/03/2014  . FH: CAD (coronary artery disease) 04/03/2014  . 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 Allergic rhinitis 11/02/2011  . Dyslipidemia 05/23/2011  . Impaired glucose tolerance 05/23/2011  . INTERNAL HEMORRHOIDS 06/08/2010  . Morbid obesity (HCC) 07/24/2007    Past Surgical History:  Procedure Laterality Date  . BREAST SURGERY    . WISDOM TOOTH EXTRACTION      OB History   No obstetric history on file.      Home Medications    Prior to Admission medications   Medication Sig Start Date End Date Taking? Authorizing Provider  acetaminophen (TYLENOL GO TABS EXTRA STRENGTH) 500 MG chewable tablet Chew 1 tablet (500 mg total) by mouth every 6 (six) hours as needed for pain. 09/20/16   Kerri PerchesSimpson, Margaret E, MD  cyclobenzaprine (FLEXERIL) 5 MG tablet One to two  tablets at bedtime , as needed, for  muscle spasm 01/13/18   Kerri PerchesSimpson, Margaret E, MD  fluticasone [facility](FLONASE) 50 MCG/ACT nasal spray Place 1 spray into both nostrils daily. 10/19/17   Kerri PerchesSimpson, Margaret E, MD  hydrocortisone-pramoxine [facility](PROCTOFOAM-HC) rectal foam Place 1 applicator rectally 2 (two) times daily. 07/30/18   Kerri PerchesSimpson, Margaret E, MD  methylPREDNISolone (MEDROL DOSEPAK) 4 MG TBPK tablet tad 08/10/18   Eustace MooreNelson, Yvonne Sue, MD  montelukast (SINGULAIR) 10 MG tablet TAKE 1 TABLET BY MOUTH EVERYDAY AT BEDTIME 02/01/18   Kerri PerchesSimpson, Margaret E, MD  norgestrel-ethinyl estradiol (LO/OVRAL,CRYSELLE) 0.3-30 MG-MCG tablet Take 1 tablet by mouth daily.    [provider]  pantoprazole (PROTONIX) 20 MG tablet Take 1 tablet (20 mg total) by mouth daily. 07/26/18   Kerri PerchesSimpson, Margaret E, MD  SUMAtriptan (IMITREX) 100 MG tablet TAKE 1 TABLET BY MOUTH MAY REPEAT IN 2HRS IF PERSISTS MAX 2 A DAY USE TWICE WEEKLY 10/15/17   Wallis BambergMani, Mario, PA-C    Family History Family History  Problem Relation Age of Onset  . Heart attack Mother   . Hyperlipidemia Father   . Heart attack Maternal Grandfather     Social History Social History   Tobacco Use  . Smoking status: Never Smoker  . Smokeless tobacco: Never Used  Substance Use Topics  . Alcohol use: No  .  Drug use: No     Allergies   Patient has no active allergies.   Review of Systems Review of Systems  Constitutional: Negative for chills and fever.  HENT: Negative for ear pain and sore throat.   Eyes: Negative for pain and visual disturbance.  Respiratory: Negative for cough and shortness of breath.   Cardiovascular: Negative for chest pain and palpitations.  Gastrointestinal: Negative for abdominal pain and vomiting.  Genitourinary: Negative for dysuria and hematuria.  Musculoskeletal: Positive for arthralgias, gait problem and joint swelling. Negative for back pain.  Skin: Negative for color change and rash.  Neurological: Negative for seizures and syncope.  All other systems  reviewed and are negative.    Physical Exam Triage Vital Signs ED Triage Vitals  Enc Vitals Group     BP 08/10/18 0829 131/72     Pulse Rate 08/10/18 0829 93     Resp 08/10/18 0829 18     Temp 08/10/18 0829 98.3 F (36.8 C)     Temp Source 08/10/18 0829 Oral     SpO2 08/10/18 0829 97 %     Weight --      Height --      Head Circumference --      Peak Flow --      Pain Score 08/10/18 0830 4     Pain Loc --      Pain Edu? --      Excl. in GC? --    No data found.  Updated Vital Signs BP 131/72 (BP Location: Left Arm)   Pulse 93   Temp 98.3 F (36.8 C) (Oral)   Resp 18   LMP 07/13/2018   SpO2 97%   Visual Acuity Right Eye Distance:   Left Eye Distance:   Bilateral Distance:    Right Eye Near:   Left Eye Near:    Bilateral Near:     Physical Exam Constitutional:      General: She is not in acute distress.    Appearance: She is well-developed.  HENT:     Head: Normocephalic and atraumatic.  Eyes:     Conjunctiva/sclera: Conjunctivae normal.     Pupils: Pupils are equal, round, and reactive to light.  Neck:     Musculoskeletal: Normal range of motion.  Cardiovascular:     Rate and Rhythm: Normal rate.  Pulmonary:     Effort: Pulmonary effort is normal. No respiratory distress.  Abdominal:     General: There is no distension.     Palpations: Abdomen is soft.  Musculoskeletal: Normal range of motion.     Comments: Mild antalgic gait.  Range of motion  Is full.  No instability to examination.  Mild tenderness around the medial lateral joint line.  Mild tenderness posteriorly.  Mild pain with patella grind testing.  Skin:    General: Skin is warm and dry.  Neurological:     General: No focal deficit present.     Mental Status: She is alert. Mental status is at baseline.  Psychiatric:        Mood and Affect: Mood normal.        Thought Content: Thought content normal.      UC Treatments / Results  Labs (all labs ordered are listed, but only abnormal  results are displayed) Labs Reviewed - No data to display  EKG None  Radiology Dg Knee Ap/lat W/sunrise Right  Result Date: 08/10/2018 CLINICAL DATA:  Right knee pain over the last 4 days. EXAM:  RIGHT KNEE 3 VIEWS COMPARISON:  None. FINDINGS: No visible effusion. Density posterior to the knee that could possibly represent a Baker's cyst. No joint space narrowing. There may be mild narrowing of the lateral patellofemoral joint. IMPRESSION: Question Baker cyst. Mild narrowing of the lateral patellofemoral joint. Electronically Signed   By: Paulina Fusi M.D.   On: 08/10/2018 08:56    Procedures Procedures (including critical care time)  Medications Ordered in UC Medications - No data to display  Initial Impression / Assessment and Plan / UC Course  I have reviewed the triage vital signs and the nursing notes.  Pertinent labs & imaging results that were available during my care of the patient were reviewed by me and considered in my medical decision making (see chart for details).     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  Overweight.  Increased Q angle.  Discussed the importance of quadricep strengthening exercises.  Exercises were demonstrated to patient in the room. Final Clinical Impressions(s) / UC Diagnoses   Final diagnoses:  Anterior knee pain, right  Baker cyst, right     Discharge Instructions     Ice for 20 min every couple of hours Limit walking for a couple of days Take the medrol ( prednisone) as directed Take all of day one today Wear brace while up/at work Follow up with Dr Lodema Hong    ED Prescriptions    Medication Sig Dispense Auth. Provider   methylPREDNISolone (MEDROL DOSEPAK) 4 MG TBPK tablet tad 21 tablet Eustace Moore, MD     Controlled Substance Prescriptions [place] Controlled Substance Registry consulted? Not Applicable   Eustace Moore, MD 08/10/18 [phone]

## 2018-08-10 NOTE — ED Triage Notes (Signed)
Pt c/o right knee pain onset 4 days that is getting worse  Denies inj/trauma, numbness  Pain radiates down RLE... 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 NAD.Marland Kitchen. ambulatory

## 2018-08-10 NOTE — Discharge Instructions (Signed)
Ice for 20 min every couple of hours Limit walking for a couple of days Take the medrol ( prednisone) as directed Take all of day one today Wear brace while up/at work Follow up with Dr Lodema Hong

## 2018-08-16 ENCOUNTER — Ambulatory Visit: Payer: BLUE CROSS/BLUE SHIELD | Admitting: Family Medicine

## 2018-08-30 ENCOUNTER — Ambulatory Visit: Payer: BLUE CROSS/BLUE SHIELD | Admitting: Family Medicine

## 2018-09-19 ENCOUNTER — Encounter: Payer: Self-pay | Admitting: Family Medicine

## 2018-09-19 ENCOUNTER — Telehealth: Payer: BC Managed Care – PPO | Admitting: Family

## 2018-09-19 DIAGNOSIS — R05 Cough: Secondary | ICD-10-CM | POA: Diagnosis not present

## 2018-09-19 DIAGNOSIS — R059 Cough, unspecified: Secondary | ICD-10-CM

## 2018-09-19 MED ORDER — BENZONATATE 100 MG PO CAPS: 100.0000 mg | ORAL_CAPSULE | Freq: Three times a day (TID) | ORAL | 0 refills | Status: DC | PRN

## 2018-09-19 NOTE — Progress Notes (Signed)
We are sorry that you are not feeling well.  Here is how we plan to help!  Based on your presentation I believe you most likely have A cough due to allergies.  I recommend that you start the an over-the counter-allergy medication such as Claritin 10 mg or Zyrtec 10 mg daily.     In addition you may use A non-prescription cough medication called Robitussin DAC. Take 2 teaspoons every 8 hours or Delsym: take 2 teaspoons every 12 hours., A non-prescription cough medication called Mucinex DM: take 2 tablets every 12 hours. and A prescription cough medication called Tessalon Perles 100mg. You may take 1-2 capsules every 8 hours as needed for your cough.    From your responses in the eVisit questionnaire you describe inflammation in the upper respiratory tract which is causing a significant cough.  This is commonly called Bronchitis and has four common causes:    Allergies  Viral Infections  Acid Reflux  Bacterial Infection Allergies, viruses and acid reflux are treated by controlling symptoms or eliminating the cause. An example might be a cough caused by taking certain blood pressure medications. You stop the cough by changing the medication. Another example might be a cough caused by acid reflux. Controlling the reflux helps control the cough.  USE OF BRONCHODILATOR ("RESCUE") INHALERS: There is a risk from using your bronchodilator too frequently.  The risk is that over-reliance on a medication which only relaxes the muscles surrounding the breathing tubes can reduce the effectiveness of medications prescribed to reduce swelling and congestion of the tubes themselves.  Although you feel brief relief from the bronchodilator inhaler, your asthma may actually be worsening with the tubes becoming more swollen and filled with mucus.  This can delay other crucial treatments, such as oral steroid medications. If you need to use a bronchodilator inhaler daily, several times per day, you should discuss this  with your provider.  There are probably better treatments that could be used to keep your asthma under control.     HOME CARE . Only take medications as instructed by your medical team. . Complete the entire course of an antibiotic. . Drink plenty of fluids and get plenty of rest. . Avoid close contacts especially the very young and the elderly . Cover your mouth if you cough or cough into your sleeve. . Always remember to wash your hands . A steam or ultrasonic humidifier can help congestion.   GET HELP RIGHT AWAY IF: . You develop worsening fever. . You become short of breath . You cough up blood. . Your symptoms persist after you have completed your treatment plan MAKE SURE YOU   Understand these instructions.  Will watch your condition.  Will get help right away if you are not doing well or get worse.  Your e-visit answers were reviewed by a board certified advanced clinical practitioner to complete your personal care plan.  Depending on the condition, your plan could have included both over the counter or prescription medications. If there is a problem please reply  once you have received a response from your provider. Your safety is important to us.  If you have drug allergies check your prescription carefully.    You can use MyChart to ask questions about today's visit, request a non-urgent call back, or ask for a work or school excuse for 24 hours related to this e-Visit. If it has been greater than 24 hours you will need to follow up with your provider, or enter   a new e-Visit to address those concerns. You will get an e-mail in the next two days asking about your experience.  I hope that your e-visit has been valuable and will speed your recovery. Thank you for using e-visits.   

## 2018-11-13 ENCOUNTER — Encounter: Payer: Self-pay | Admitting: Family Medicine

## 2018-11-13 ENCOUNTER — Telehealth: Payer: Self-pay

## 2018-11-13 DIAGNOSIS — E785 Hyperlipidemia, unspecified: Secondary | ICD-10-CM

## 2018-11-13 DIAGNOSIS — E559 Vitamin D deficiency, unspecified: Secondary | ICD-10-CM

## 2018-11-13 DIAGNOSIS — R7302 Impaired glucose tolerance (oral): Secondary | ICD-10-CM

## 2018-11-13 NOTE — Telephone Encounter (Signed)
Labs ordered.

## 2018-11-28 ENCOUNTER — Ambulatory Visit: Payer: BLUE CROSS/BLUE SHIELD | Admitting: Family Medicine

## 2018-12-03 ENCOUNTER — Ambulatory Visit: Payer: BLUE CROSS/BLUE SHIELD | Admitting: Family Medicine

## 2018-12-10 ENCOUNTER — Encounter: Payer: Self-pay | Admitting: Physician Assistant

## 2018-12-10 ENCOUNTER — Telehealth: Payer: BLUE CROSS/BLUE SHIELD | Admitting: Physician Assistant

## 2018-12-10 DIAGNOSIS — N390 Urinary tract infection, site not specified: Secondary | ICD-10-CM | POA: Diagnosis not present

## 2018-12-10 DIAGNOSIS — M549 Dorsalgia, unspecified: Secondary | ICD-10-CM

## 2018-12-10 DIAGNOSIS — G8929 Other chronic pain: Secondary | ICD-10-CM

## 2018-12-10 MED ORDER — NITROFURANTOIN MONOHYD MACRO 100 MG PO CAPS: 100.0000 mg | ORAL_CAPSULE | Freq: Two times a day (BID) | ORAL | 0 refills | Status: DC

## 2018-12-10 NOTE — Progress Notes (Signed)
We are sorry that you are not feeling well.  Here is how we plan to help!  Based on what you shared with me it looks like you most likely have a simple urinary tract infection.  A UTI (Urinary Tract Infection) is a bacterial infection of the bladder.  Most cases of urinary tract infections are simple to treat but a key part of your care is to encourage you to drink plenty of fluids and watch your symptoms carefully.  I have prescribed MacroBid 100 mg twice a day for 5 days.  Your symptoms should gradually improve. Call us if the burning in your urine worsens, you develop worsening fever, back pain or pelvic pain or if your symptoms do not resolve after completing the antibiotic.  You have indicated that your back pain is not related to your UTI symptoms. If you develop any changes to your chronic back pain, or if you develop any new symptoms, pleases follow up with your doctor, submit an Evisit, or go to an urgent care center.   Urinary tract infections can be prevented by drinking plenty of water to keep your body hydrated.  Also be sure when you wipe, wipe from front to back and don't hold it in!  If possible, empty your bladder every 4 hours.  Your e-visit answers were reviewed by a board certified advanced clinical practitioner to complete your personal care plan.  Depending on the condition, your plan could have included both over the counter or prescription medications.  If there is a problem please reply  once you have received a response from your provider.  Your safety is important to [place].  If you have drug allergies check your prescription carefully.    You can use MyChart to ask questions about today's visit, request a non-urgent call back, or ask for a work or school excuse for 24 hours related to this e-Visit. If it has been greater than 24 hours you will need to follow up with your provider, or enter a new e-Visit to address those concerns.   You will get an e-mail in the next two  days asking about your experience.  I hope that your e-visit has been valuable and will speed your recovery. Thank you for using e-visits.   I spent 5-10 minutes on review and completion of this note- Lacy Duverney [facility]

## 2019-01-07 ENCOUNTER — Other Ambulatory Visit: Payer: Self-pay

## 2019-01-07 ENCOUNTER — Ambulatory Visit (INDEPENDENT_AMBULATORY_CARE_PROVIDER_SITE_OTHER): Payer: BC Managed Care – PPO | Admitting: Family Medicine

## 2019-01-07 ENCOUNTER — Encounter: Payer: Self-pay | Admitting: Family Medicine

## 2019-01-07 VITALS — BP 120/82 | HR 89 | Temp 98.8°F | Ht 65.5 in | Wt 238.0 lb

## 2019-01-07 DIAGNOSIS — M25562 Pain in left knee: Secondary | ICD-10-CM | POA: Diagnosis not present

## 2019-01-07 DIAGNOSIS — Z8249 Family history of ischemic heart disease and other diseases of the circulatory system: Secondary | ICD-10-CM | POA: Diagnosis not present

## 2019-01-07 DIAGNOSIS — R7302 Impaired glucose tolerance (oral): Secondary | ICD-10-CM

## 2019-01-07 DIAGNOSIS — J3089 Other allergic rhinitis: Secondary | ICD-10-CM

## 2019-01-07 DIAGNOSIS — E785 Hyperlipidemia, unspecified: Secondary | ICD-10-CM

## 2019-01-07 DIAGNOSIS — G43009 Migraine without aura, not intractable, without status migrainosus: Secondary | ICD-10-CM

## 2019-01-07 MED ORDER — IBUPROFEN 800 MG PO TABS: 800.0000 mg | ORAL_TABLET | Freq: Three times a day (TID) | ORAL | 1 refills | Status: DC | PRN

## 2019-01-07 NOTE — Patient Instructions (Addendum)
F/U in 6 months, call if you need me before  For arthritis use tylenol over ibuprofen for arthritis, safer, with no problem for kidney and heart  Please get fasting labs asa, HBA1C to be added to order pls, lab is at [place] ( [place])  Please let me know your preference fore cardiology eval, based on strong f/h of premature CAD, Grandfather died at age 37 of CAD and your Mom  At age 4  Weight loss goal of 12 pounds in 6 months  It is important that you exercise regularly at least 30 minutes 5 times a week. If you develop chest pain, have severe difficulty breathing, or feel very tired, stop exercising immediately and seek medical attention   Thanks for choosing [facility], we consider it a privelige to serve you.

## 2019-01-07 NOTE — Progress Notes (Signed)
Shawna Gonzales     MRN: [id number]      DOB: 1980/12/27   HPI Shawna Gonzales is here for follow up and re-evaluation of chronic medical conditions, medication management and review of any available recent lab and radiology data.  Preventive health is updated, specifically  Cancer screening and Immunization.   Questions or concerns regarding consultations or procedures which the PT has had in the interim are  addressed. The PT denies any adverse reactions to current medications since the last visit.  s InROS Denies recent fever or chills. Denies sinus pressure, nasal congestion, ear pain or sore throat. Denies chest congestion, productive cough or wheezing. Denies chest pains, palpitations and leg swelling Denies abdominal pain, nausea, vomiting,diarrhea or constipation.   Denies dysuria, frequency, hesitancy or incontinence. Denies joint pain, swelling and limitation in mobility. Denies headaches, seizures, numbness, or tingling. Denies depression, anxiety or insomnia. Denies skin break down or rash.   PE  BP 120/82 (BP Location: Right Arm, Patient Position: Sitting, Cuff Size: Normal)   Pulse 89   Temp 98.8 F (37.1 C)   Ht 5' 5.5" (1.664 m)   Wt 238 lb (108 kg)   SpO2 98%   BMI 39.00 kg/m   Patient alert and oriented and in no cardiopulmonary distress.  HEENT: No facial asymmetry, EOMI,   oropharynx pink and moist.  Neck supple   Chest: Clear to auscultation bilaterally.  CVS: S1, S2 no murmurs, no S3.Regular rate.  ABD: Soft non tender.   Ext: No edema  MS: Adequate ROM spine, shoulders, hips and reduced in  knees.  Skin: Intact, no ulcerations or rash noted.  Psych: Good eye contact, normal affect. Memory intact not anxious or depressed appearing.  CNS: CN 2-12 intact, power,  normal throughout.no focal deficits noted.   Assessment & Plan  FH: CAD (coronary artery disease) Pt requesting cardiology re eval based on f/h of premature CAD, will refer once  she provides the name of Provider she wishes to see  Dyslipidemia Hyperlipidemia:Low fat diet discussed and encouraged.   Lipid Panel  Lab Results  Component Value Date   CHOL 137 01/08/2019   HDL 41 (L) 01/08/2019   LDLCALC 73 01/08/2019   TRIG 151 (H) 01/08/2019   CHOLHDL 3.3 01/08/2019  Needs to reduce fried and fatty foods and increase exercise     Left anterior knee pain Chronic knee pain, left worse than right, encouraged use of tylenol rather than an NSAID for pain as well as weight loss and quad strengthening  Morbid obesity ([place]) Obesity linked with dylipidemia, and arthritis  Patient re-educated about  the importance of commitment to a  minimum of 150 minutes of exercise per week as able.  The importance of healthy food choices with portion control discussed, as well as eating regularly and within a 12 hour window most days. The need to choose "clean , green" food 50 to 75% of the time is discussed, as well as to make water the primary drink and set a goal of 64 ounces water daily.    Weight /BMI 01/07/2019 10/19/2017 09/20/2016  WEIGHT 238 lb 233 lb 238 lb  HEIGHT 5' 5.5" 5\' 5"  5\' 5"   BMI 39 kg/m2 38.77 kg/m2 39.61 kg/m2      Allergic rhinitis No current or recent flares uses medication OTC as needed  Migraine Not currently or recently needing / taking prophylactic med and irregular headaches , on avg once per 4 to 6 weeks,uses OTC analgesia

## 2019-01-08 DIAGNOSIS — E559 Vitamin D deficiency, unspecified: Secondary | ICD-10-CM | POA: Diagnosis not present

## 2019-01-08 DIAGNOSIS — E785 Hyperlipidemia, unspecified: Secondary | ICD-10-CM | POA: Diagnosis not present

## 2019-01-09 ENCOUNTER — Encounter: Payer: Self-pay | Admitting: Family Medicine

## 2019-01-09 LAB — CBC
HCT: 39.6 % (ref 35.0–45.0)
Hemoglobin: 12.8 g/dL (ref 11.7–15.5)
MCH: 27 pg (ref 27.0–33.0)
MCHC: 32.3 g/dL (ref 32.0–36.0)
MCV: 83.5 fL (ref 80.0–100.0)
MPV: 11 fL (ref 7.5–12.5)
Platelets: 328 10*3/uL (ref 140–400)
RBC: 4.74 10*6/uL (ref 3.80–5.10)
RDW: 13.3 % (ref 11.0–15.0)
WBC: 8.5 10*3/uL (ref 3.8–10.8)

## 2019-01-09 LAB — COMPLETE METABOLIC PANEL WITH GFR
AG Ratio: 1.4 (calc) (ref 1.0–2.5)
ALT: 13 U/L (ref 6–29)
AST: 12 U/L (ref 10–30)
Albumin: 3.7 g/dL (ref 3.6–5.1)
Alkaline phosphatase (APISO): 79 U/L (ref 31–125)
BUN: 12 mg/dL (ref 7–25)
CO2: 25 mmol/L (ref 20–32)
Calcium: 9.1 mg/dL (ref 8.6–10.2)
Chloride: 106 mmol/L (ref 98–110)
Creat: 0.87 mg/dL (ref 0.50–1.10)
GFR, Est African American: 99 mL/min/{1.73_m2} (ref >=60)
GFR, Est Non African American: 85 mL/min/{1.73_m2} (ref >=60)
Globulin: 2.7 g/dL (calc) (ref 1.9–3.7)
Glucose, Bld: 92 mg/dL (ref 65–99)
Potassium: 4.2 mmol/L (ref 3.5–5.3)
Sodium: 139 mmol/L (ref 135–146)
Total Bilirubin: 0.3 mg/dL (ref 0.2–1.2)
Total Protein: 6.4 g/dL (ref 6.1–8.1)

## 2019-01-09 LAB — TSH: TSH: 2.52 mIU/L

## 2019-01-09 LAB — LIPID PANEL
Cholesterol: 137 mg/dL (ref <200)
HDL: 41 mg/dL — ABNORMAL LOW (ref >=50)
LDL Cholesterol (Calc): 73 mg/dL (calc)
Non-HDL Cholesterol (Calc): 96 mg/dL (calc) (ref <130)
Total CHOL/HDL Ratio: 3.3 (calc) (ref <5.0)
Triglycerides: 151 mg/dL — ABNORMAL HIGH (ref <150)

## 2019-01-09 LAB — VITAMIN D 25 HYDROXY (VIT D DEFICIENCY, FRACTURES): Vit D, 25-Hydroxy: 28 ng/mL — ABNORMAL LOW (ref 30–100)

## 2019-01-13 ENCOUNTER — Encounter: Payer: Self-pay | Admitting: Family Medicine

## 2019-01-13 NOTE — Assessment & Plan Note (Signed)
Patient educated about the importance of limiting  Carbohydrate intake , the need to commit to daily physical activity for a minimum of 30 minutes , and to commit weight loss. The fact that changes in all these areas will reduce or eliminate all together the development of diabetes is stressed.   Diabetic Labs Latest Ref Rng & Units 01/08/2019 10/19/2017 07/11/2016 05/18/2015 04/08/2014  HbA1c <5.7 % of total Hgb - 5.4 5.2 5.7(H) 5.6  Chol <200 mg/dL 137 135 156 156 153  HDL > OR = 50 mg/dL 41(L) 44(L) 44(L) 43(L) 43  Calc LDL mg/dL (calc) 73 71 86 91 85  Triglycerides <150 mg/dL 151(H) 117 129 109 124  Creatinine 0.50 - 1.10 mg/dL 0.87 0.81 0.86 0.67 0.89   BP/Weight 01/07/2019 08/10/2018 10/19/2017 10/15/2017 09/20/2016 07/18/2016 90/09/90  Systolic BP 330 076 226 333 545 625 638  Diastolic BP 82 72 80 73 68 84 78  Wt. (Lbs) 238 - 233 - 238 239.6 229.8  BMI 39 - 38.77 - 39.61 39.87 38.24   No flowsheet data found.

## 2019-01-13 NOTE — Assessment & Plan Note (Signed)
Pt requesting cardiology re eval based on f/h of premature CAD, will refer once she provides the name of Provider she wishes to see

## 2019-01-13 NOTE — Assessment & Plan Note (Signed)
Chronic knee pain, left worse than right, encouraged use of tylenol rather than an NSAID for pain as well as weight loss and quad strengthening

## 2019-01-13 NOTE — Assessment & Plan Note (Signed)
Hyperlipidemia:Low fat diet discussed and encouraged.   Lipid Panel  Lab Results  Component Value Date   CHOL 137 01/08/2019   HDL 41 (L) 01/08/2019   LDLCALC 73 01/08/2019   TRIG 151 (H) 01/08/2019   CHOLHDL 3.3 01/08/2019  Needs to reduce fried and fatty foods and increase exercise

## 2019-01-13 NOTE — Assessment & Plan Note (Signed)
Not currently or recently needing / taking prophylactic med and irregular headaches , on avg once per 4 to 6 weeks,uses OTC analgesia

## 2019-01-13 NOTE — Assessment & Plan Note (Signed)
No current or recent flares uses medication OTC as needed

## 2019-01-13 NOTE — Assessment & Plan Note (Signed)
Obesity linked with dylipidemia, and arthritis  Patient re-educated about  the importance of commitment to a  minimum of 150 minutes of exercise per week as able.  The importance of healthy food choices with portion control discussed, as well as eating regularly and within a 12 hour window most days. The need to choose "clean , green" food 50 to 75% of the time is discussed, as well as to make water the primary drink and set a goal of 64 ounces water daily.    Weight /BMI 01/07/2019 10/19/2017 09/20/2016  WEIGHT 238 lb 233 lb 238 lb  HEIGHT 5' 5.5" 5\' 5"  5\' 5"   BMI 39 kg/m2 38.77 kg/m2 39.61 kg/m2

## 2019-02-08 ENCOUNTER — Encounter: Payer: Self-pay | Admitting: Family Medicine

## 2019-02-09 ENCOUNTER — Other Ambulatory Visit: Payer: Self-pay | Admitting: Family Medicine

## 2019-02-11 ENCOUNTER — Other Ambulatory Visit: Payer: Self-pay | Admitting: Family Medicine

## 2019-02-11 MED ORDER — CYCLOBENZAPRINE HCL 10 MG PO TABS: 10.0000 mg | ORAL_TABLET | Freq: Every day | ORAL | 1 refills | Status: DC

## 2019-02-11 NOTE — Progress Notes (Signed)
Flexeril is sent in

## 2019-02-27 DIAGNOSIS — K115 Sialolithiasis: Secondary | ICD-10-CM | POA: Diagnosis not present

## 2019-02-27 DIAGNOSIS — J351 Hypertrophy of tonsils: Secondary | ICD-10-CM | POA: Diagnosis not present

## 2019-02-27 DIAGNOSIS — K1123 Chronic sialoadenitis: Secondary | ICD-10-CM | POA: Diagnosis not present

## 2019-04-22 ENCOUNTER — Encounter: Payer: Self-pay | Admitting: Family Medicine

## 2019-06-13 ENCOUNTER — Encounter: Payer: Self-pay | Admitting: Family Medicine

## 2019-06-17 ENCOUNTER — Encounter: Payer: Self-pay | Admitting: Family Medicine

## 2019-06-17 ENCOUNTER — Ambulatory Visit (INDEPENDENT_AMBULATORY_CARE_PROVIDER_SITE_OTHER): Payer: BC Managed Care – PPO | Admitting: Family Medicine

## 2019-06-17 ENCOUNTER — Other Ambulatory Visit: Payer: Self-pay

## 2019-06-17 VITALS — BP 126/80 | HR 86 | Resp 15 | Ht 65.0 in | Wt 219.1 lb

## 2019-06-17 DIAGNOSIS — E669 Obesity, unspecified: Secondary | ICD-10-CM | POA: Diagnosis not present

## 2019-06-17 DIAGNOSIS — E785 Hyperlipidemia, unspecified: Secondary | ICD-10-CM | POA: Diagnosis not present

## 2019-06-17 DIAGNOSIS — N644 Mastodynia: Secondary | ICD-10-CM

## 2019-06-17 DIAGNOSIS — E559 Vitamin D deficiency, unspecified: Secondary | ICD-10-CM

## 2019-06-17 DIAGNOSIS — N6310 Unspecified lump in the right breast, unspecified quadrant: Secondary | ICD-10-CM | POA: Diagnosis not present

## 2019-06-17 NOTE — Patient Instructions (Addendum)
F/U in office with MD in 6 months, call if you need  Me before  Congrats on weight loss , keep it up  Please get fasting lipid and vit D first week in Feb  You are referred for an [place] of your right breast , we will contact you with appt information   It is important that you exercise regularly at least 30 minutes 5 times a week. If you develop chest pain, have severe difficulty breathing, or feel very tired, stop exercising immediately and seek medical attention   Think about what you will eat, plan ahead. Choose " clean, green, fresh or frozen" over canned, processed or packaged foods which are more sugary, salty and fatty. 70 to 75% of food eaten should be vegetables and fruit. Three meals at set times with snacks allowed between meals, but they must be fruit or vegetables. Aim to eat over a 12 hour period , example 7 am to 7 pm, and STOP after  your last meal of the day. Drink water,generally about 64 ounces per day, no other drink is as healthy. Fruit juice is best enjoyed in a healthy way, by EATING the fruit.

## 2019-06-17 NOTE — Assessment & Plan Note (Signed)
teneder pea sized nodule at 6 o clock, right breast felt  5 days prior , refer for [place]

## 2019-06-17 NOTE — Progress Notes (Signed)
   Shawna Gonzales     MRN: [id number]      DOB: 14-Feb-1981   HPI Ms. Israelson is here c/o tender  Lump around the 6 o clock position of right breast approx size, nickel.Initially fel;t 5 days ago, seems to have reduced in size, still painful.  No niplle discharege or palapble lymphy nodes. No known f/h of breast cancer ROS Denies recent fever or chills. Denies sinus pressure, nasal congestion, ear pain or sore throat. Denies chest congestion, productive cough or wheezing. Denies chest pains, palpitations and leg swelling Denies abdominal pain, nausea, vomiting,diarrhea or constipation.   Denies dysuria, frequency, hesitancy or incontinence. Denies joint pain, swelling and limitation in mobility. Denies headaches, seizures, numbness, or tingling. Denies depression, anxiety or insomnia. Denies skin break down or rash.   PE  BP 126/80   Pulse 86   Resp 15   Ht 5\' 5"  (1.651 m)   Wt 219 lb 1.9 oz (99.4 kg)   SpO2 98%   BMI 36.46 kg/m   Patient alert and oriented and in no cardiopulmonary distress.  HEENT: No facial asymmetry, EOMI,     Neck supple .  Chest: Clear to auscultation bilaterally. Breast: left breast non tender, no mass, nipple inversion or discharge. Right breast : tender nodule at 6 o clock, no nipple inversion or discharge. No supraclavicular or axillary adenopathy CVS: S1, S2 no murmurs, no S3.Regular rate.  ABD: Soft non tender.   Ext: No edema  MS: Adequate ROM spine, shoulders, hips and knees.  Skin: Intact, no ulcerations or rash noted.  Psych: Good eye contact, normal affect. Memory intact not anxious or depressed appearing.  CNS: CN 2-12 intact, power,  normal throughout.no focal deficits noted.   Assessment & Plan  Breast mass in female teneder pea sized nodule at 6 o clock, right breast felt  5 days prior , refer for [place]  Mastalgia in female Right mastalgia around 6 o clock position  Morbid obesity ([place]) Marked improvement with  lifestyle change , she is applauded on this and encouraged to keep this up  Patient re-educated about  the importance of commitment to a  minimum of 150 minutes of exercise per week as able.  The importance of healthy food choices with portion control discussed, as well as eating regularly and within a 12 hour window most days. The need to choose "clean , green" food 50 to 75% of the time is discussed, as well as to make water the primary drink and set a goal of 64 ounces water daily.    Weight /BMI 06/17/2019 01/07/2019 10/19/2017  WEIGHT 219 lb 1.9 oz 238 lb 233 lb  HEIGHT 5\' 5"  5' 5.5" 5\' 5"   BMI 36.46 kg/m2 39 kg/m2 38.77 kg/m2

## 2019-06-23 ENCOUNTER — Encounter: Payer: Self-pay | Admitting: Family Medicine

## 2019-06-23 DIAGNOSIS — N644 Mastodynia: Secondary | ICD-10-CM | POA: Insufficient documentation

## 2019-06-23 NOTE — Assessment & Plan Note (Signed)
Right mastalgia around 6 o clock position

## 2019-06-23 NOTE — Assessment & Plan Note (Signed)
Marked improvement with lifestyle change , she is applauded on this and encouraged to keep this up  Patient re-educated about  the importance of commitment to a  minimum of 150 minutes of exercise per week as able.  The importance of healthy food choices with portion control discussed, as well as eating regularly and within a 12 hour window most days. The need to choose "clean , green" food 50 to 75% of the time is discussed, as well as to make water the primary drink and set a goal of 64 ounces water daily.    Weight /BMI 06/17/2019 01/07/2019 10/19/2017  WEIGHT 219 lb 1.9 oz 238 lb 233 lb  HEIGHT 5\' 5"  5' 5.5" 5\' 5"   BMI 36.46 kg/m2 39 kg/m2 38.77 kg/m2

## 2019-07-02 ENCOUNTER — Ambulatory Visit (HOSPITAL_COMMUNITY)
Admission: RE | Admit: 2019-07-02 | Discharge: 2019-07-02 | Disposition: A | Discharge status: Home / Self Care | Payer: BC Managed Care – PPO | Source: Ambulatory Visit | Attending: Family Medicine | Admitting: Family Medicine

## 2019-07-02 ENCOUNTER — Other Ambulatory Visit: Payer: Self-pay

## 2019-07-02 ENCOUNTER — Encounter (HOSPITAL_COMMUNITY): Payer: Self-pay

## 2019-07-02 ENCOUNTER — Other Ambulatory Visit: Payer: Self-pay | Admitting: Family Medicine

## 2019-07-02 ENCOUNTER — Ambulatory Visit (HOSPITAL_COMMUNITY): Admission: RE | Admit: 2019-07-02 | Payer: BC Managed Care – PPO | Source: Ambulatory Visit

## 2019-07-02 DIAGNOSIS — N6310 Unspecified lump in the right breast, unspecified quadrant: Secondary | ICD-10-CM

## 2019-07-03 ENCOUNTER — Encounter: Payer: Self-pay | Admitting: Family Medicine

## 2019-07-04 ENCOUNTER — Telehealth: Payer: Self-pay

## 2019-07-04 ENCOUNTER — Encounter: Payer: Self-pay | Admitting: Family Medicine

## 2019-07-04 NOTE — Telephone Encounter (Signed)
Medical\Dental Physical Exam  Copied Sleeved  noted

## 2019-07-10 ENCOUNTER — Ambulatory Visit: Payer: BC Managed Care – PPO | Admitting: Family Medicine

## 2019-07-22 ENCOUNTER — Encounter: Payer: Self-pay | Admitting: Family Medicine

## 2019-07-22 ENCOUNTER — Other Ambulatory Visit: Payer: Self-pay

## 2019-07-22 MED ORDER — IBUPROFEN 800 MG PO TABS: 800.0000 mg | ORAL_TABLET | Freq: Three times a day (TID) | ORAL | 1 refills | Status: DC | PRN

## 2019-07-23 ENCOUNTER — Other Ambulatory Visit: Payer: Self-pay | Admitting: Family Medicine

## 2019-07-23 MED ORDER — SUMATRIPTAN SUCCINATE 100 MG PO TABS: 100.0000 mg | ORAL_TABLET | ORAL | 1 refills | Status: DC | PRN

## 2019-09-19 ENCOUNTER — Encounter: Payer: Self-pay | Admitting: Family Medicine

## 2019-09-20 ENCOUNTER — Encounter: Payer: Self-pay | Admitting: Family Medicine

## 2019-09-22 ENCOUNTER — Other Ambulatory Visit: Payer: Self-pay | Admitting: Family Medicine

## 2019-09-22 DIAGNOSIS — K115 Sialolithiasis: Secondary | ICD-10-CM

## 2019-10-10 ENCOUNTER — Encounter: Payer: Self-pay | Admitting: Family Medicine

## 2019-11-04 ENCOUNTER — Encounter: Payer: Self-pay | Admitting: Family Medicine

## 2019-11-15 ENCOUNTER — Encounter: Payer: Self-pay | Admitting: Family Medicine

## 2019-11-15 NOTE — Telephone Encounter (Signed)
Being the office closes at 12pm pt advised to go to urgent care to be evaluated with verbal understanding

## 2019-12-16 ENCOUNTER — Ambulatory Visit: Payer: BC Managed Care – PPO | Admitting: Family Medicine

## 2019-12-21 ENCOUNTER — Telehealth: Payer: BC Managed Care – PPO | Admitting: Physician Assistant

## 2019-12-21 DIAGNOSIS — R05 Cough: Secondary | ICD-10-CM

## 2019-12-21 DIAGNOSIS — R059 Cough, unspecified: Secondary | ICD-10-CM

## 2019-12-21 DIAGNOSIS — J069 Acute upper respiratory infection, unspecified: Secondary | ICD-10-CM

## 2019-12-21 MED ORDER — FLUTICASONE PROPIONATE 50 MCG/ACT NA SUSP: 2.0000 | Freq: Every day | NASAL | 6 refills | Status: AC

## 2019-12-21 MED ORDER — BENZONATATE 100 MG PO CAPS: 100.0000 mg | ORAL_CAPSULE | Freq: Three times a day (TID) | ORAL | 0 refills | Status: DC | PRN

## 2019-12-21 NOTE — Progress Notes (Signed)
We are sorry you are not feeling well.  Here is how we plan to help! ° °Based on what you have shared with me, it looks like you may have a viral upper respiratory infection.  Upper respiratory infections are caused by a large number of viruses; however, rhinovirus is the most common cause.  ° °Symptoms vary from person to person, with common symptoms including sore throat, cough, fatigue or lack of energy and feeling of general discomfort.  A low-grade fever of up to 100.4 may present, but is often uncommon.  Symptoms vary however, and are closely related to a person's age or underlying illnesses.  The most common symptoms associated with an upper respiratory infection are nasal discharge or congestion, cough, sneezing, headache and pressure in the ears and face.  These symptoms usually persist for about 3 to 10 days, but can last up to 2 weeks.  It is important to know that upper respiratory infections do not cause serious illness or complications in most cases.   ° °Upper respiratory infections can be transmitted from person to person, with the most common method of transmission being a person's hands.  The virus is able to live on the skin and can infect other persons for up to 2 hours after direct contact.  Also, these can be transmitted when someone coughs or sneezes; thus, it is important to cover the mouth to reduce this risk.  To keep the spread of the illness at bay, good hand hygiene is very important. ° °This is an infection that is most likely caused by a virus. There are no specific treatments other than to help you with the symptoms until the infection runs its course.  We are sorry you are not feeling well.  Here is how we plan to help! ° ° °For nasal congestion, you may use an oral decongestants such as Mucinex D or if you have glaucoma or high blood pressure use plain Mucinex.  Saline nasal spray or nasal drops can help and can safely be used as often as needed for congestion.  For your congestion,  I have prescribed Fluticasone nasal spray one spray in each nostril twice a day ° °If you do not have a history of heart disease, hypertension, diabetes or thyroid disease, prostate/bladder issues or glaucoma, you may also use Sudafed to treat nasal congestion.  It is highly recommended that you consult with a pharmacist or your primary care physician to ensure this medication is safe for you to take.    ° °If you have a cough, you may use cough suppressants such as Delsym and Robitussin.  If you have glaucoma or high blood pressure, you can also use Coricidin HBP.   °For cough I have prescribed for you A prescription cough medication called Tessalon Perles 100 mg. You may take 1-2 capsules every 8 hours as needed for cough ° °If you have a sore or scratchy throat, use a saltwater gargle- ¼ to ½ teaspoon of salt dissolved in a 4-ounce to 8-ounce glass of warm water.  Gargle the solution for approximately 15-30 seconds and then spit.  It is important not to swallow the solution.  You can also use throat lozenges/cough drops and Chloraseptic spray to help with throat pain or discomfort.  Warm or cold liquids can also be helpful in relieving throat pain. ° °For headache, pain or general discomfort, you can use Ibuprofen or Tylenol as directed.   °Some authorities believe that zinc sprays or the use of   Echinacea may shorten the course of your symptoms. ° ° °HOME CARE °• Only take medications as instructed by your medical team. °• Be sure to drink plenty of fluids. Water is fine as well as fruit juices, sodas and electrolyte beverages. You may want to stay away from caffeine or alcohol. If you are nauseated, try taking small sips of liquids. How do you know if you are getting enough fluid? Your urine should be a pale yellow or almost colorless. °• Get rest. °• Taking a steamy shower or using a humidifier may help nasal congestion and ease sore throat pain. You can place a towel over your head and breathe in the steam  from hot water coming from a faucet. °• Using a saline nasal spray works much the same way. °• Cough drops, hard candies and sore throat lozenges may ease your cough. °• Avoid close contacts especially the very young and the elderly °• Cover your mouth if you cough or sneeze °• Always remember to wash your hands.  ° °GET HELP RIGHT AWAY IF: °• You develop worsening fever. °• If your symptoms do not improve within 10 days °• You develop yellow or green discharge from your nose over 3 days. °• You have coughing fits °• You develop a severe head ache or visual changes. °• You develop shortness of breath, difficulty breathing or start having chest pain °• Your symptoms persist after you have completed your treatment plan ° °MAKE SURE YOU  °· Understand these instructions. °· Will watch your condition. °· Will get help right away if you are not doing well or get worse. ° °Your e-visit answers were reviewed by a board certified advanced clinical practitioner to complete your personal care plan. Depending upon the condition, your plan could have included both over the counter or prescription medications. °Please review your pharmacy choice. If there is a problem, you may call our nursing hot line at and have the prescription routed to another pharmacy. °Your safety is important to us. If you have drug allergies check your prescription carefully.  ° °You can use MyChart to ask questions about today’s visit, request a non-urgent call back, or ask for a work or school excuse for 24 hours related to this e-Visit. If it has been greater than 24 hours you will need to follow up with your provider, or enter a new e-Visit to address those concerns. °You will get an e-mail in the next two days asking about your experience.  I hope that your e-visit has been valuable and will speed your recovery. Thank you for using e-visits. ° ° ° ° °Greater than 5 minutes, yet less than 10 minutes of time have been spent researching, coordinating  and implementing care for this patient today.  ° °

## 2019-12-26 ENCOUNTER — Telehealth (INDEPENDENT_AMBULATORY_CARE_PROVIDER_SITE_OTHER): Payer: Self-pay | Admitting: Family Medicine

## 2019-12-26 ENCOUNTER — Encounter: Payer: Self-pay | Admitting: Family Medicine

## 2019-12-26 ENCOUNTER — Other Ambulatory Visit: Payer: Self-pay

## 2019-12-26 VITALS — BP 126/80 | Ht 65.0 in | Wt 219.0 lb

## 2019-12-26 DIAGNOSIS — B373 Candidiasis of vulva and vagina: Secondary | ICD-10-CM

## 2019-12-26 DIAGNOSIS — J988 Other specified respiratory disorders: Secondary | ICD-10-CM

## 2019-12-26 DIAGNOSIS — B3731 Acute candidiasis of vulva and vagina: Secondary | ICD-10-CM

## 2019-12-26 MED ORDER — FLUCONAZOLE 150 MG PO TABS: 150.0000 mg | ORAL_TABLET | Freq: Every day | ORAL | 0 refills | Status: DC

## 2019-12-26 MED ORDER — AMOXICILLIN 500 MG PO CAPS: 500.0000 mg | ORAL_CAPSULE | Freq: Two times a day (BID) | ORAL | 0 refills | Status: AC

## 2019-12-26 NOTE — Progress Notes (Signed)
Virtual Visit via Video Note   This visit type was conducted due to national recommendations for restrictions regarding the COVID-19 Pandemic (e.g. social distancing) in an effort to limit this patient's exposure and mitigate transmission in our community.  Due to her co-morbid illnesses, this patient is at least at moderate risk for complications without adequate follow up.  This format is felt to be most appropriate for this patient at this time.  All issues noted in this document were discussed and addressed.  A limited physical exam was performed with this format.   Evaluation Performed:  Follow-up visit  Date:  12/26/2019   ID:  Shawna Gonzales, DOB Aug 05, 1980, MRN [id number]  Patient Location: Home Provider Location: Office  Location of Patient: Home Location of Provider: Telehealth Consent was obtain for visit to be over via telehealth. I verified that I am speaking with the correct person using two identifiers.  PCP:  Fayrene Helper, MD   Chief Complaint:  Ongoing worsening cold symptoms   History of Present Illness:    Shawna Gonzales is a 39 y.o. female with 8-day history of having cold-like symptoms.  Has not been diagnosed with Covid.  Has not been tested for Covid.  Has received doses of Covid vaccine.  Experiencing increased nasal congestion that has thick drainage that is now green in color.  Runny nose, sneezing, throat pain, cough.,  Headache and facial pressure as the increased last 2 days.  She has hoarseness and pain with swallowing.  Did a ED visit on June 19 Saturday was diagnosed with a viral upper respiratory infection however she is just worsened over the last several days.  Additionally she was exposed to strep to weeks ago.  Did try Mucinex over-the-counter without much relief.  Has tried Flonase and benzonatate Perles without success.  The patient does not have symptoms concerning for COVID-19 infection (fever, chills, cough, or new shortness of  breath).   Past Medical, Surgical, Social History, Allergies, and Medications have been Reviewed.  Past Medical History:  Diagnosis Date  . Allergy    perrenial allergies  . Arthritis 2013 and 2020   left knee and right , mild  . Contraceptive management   . GERD (gastroesophageal reflux disease)   . High cholesterol   . Macromastia   . Obesity    Past Surgical History:  Procedure Laterality Date  . BREAST SURGERY    . REDUCTION MAMMAPLASTY Bilateral   . WISDOM TOOTH EXTRACTION       Current Meds  Medication Sig  . benzonatate (TESSALON) 100 MG capsule Take 1-2 capsules (100-200 mg total) by mouth 3 (three) times daily as needed for cough.  . cyclobenzaprine (FLEXERIL) 10 MG tablet Take 1 tablet (10 mg total) by mouth at bedtime.  . fluticasone (FLONASE) 50 MCG/ACT nasal spray Place 2 sprays into both nostrils daily.  Marland Kitchen ibuprofen (ADVIL) 800 MG tablet Take 1 tablet (800 mg total) by mouth every 8 (eight) hours as needed.  . SUMAtriptan (IMITREX) 100 MG tablet Take 1 tablet (100 mg total) by mouth every 2 (two) hours as needed for migraine. May repeat in 2 hours if headache persists or recurs.Maximumum is 2 tablets in 24 hours.     Allergies:   Patient has no known allergies.   ROS:   Please see the history of present illness.    All other systems reviewed and are negative.   Labs/Other Tests and Data Reviewed:    Recent Labs:  01/08/2019: ALT 13; BUN 12; Creat 0.87; Hemoglobin 12.8; Platelets 328; Potassium 4.2; Sodium 139; TSH 2.52   Recent Lipid Panel Lab Results  Component Value Date/Time   CHOL 137 01/08/2019 07:04 AM   CHOL 153 04/08/2014 08:02 AM   TRIG 151 (H) 01/08/2019 07:04 AM   TRIG 124 04/08/2014 08:02 AM   HDL 41 (L) 01/08/2019 07:04 AM   HDL 43 04/08/2014 08:02 AM   CHOLHDL 3.3 01/08/2019 07:04 AM   LDLCALC 73 01/08/2019 07:04 AM   LDLCALC 85 04/08/2014 08:02 AM    Wt Readings from Last 3 Encounters:  12/26/19 219 lb (99.3 kg)  06/17/19 219 lb  1.9 oz (99.4 kg)  01/07/19 238 lb (108 kg)     Objective:    Vital Signs:  BP 126/80   Ht 5\' 5"  (1.651 m)   Wt 219 lb (99.3 kg)   BMI 36.44 kg/m    VITAL SIGNS:  reviewed GEN:  no acute distress EYES:  sclerae anicteric, EOMI - Extraocular Movements Intact RESPIRATORY:  normal respiratory effort, symmetric expansion SKIN:  no rash, lesions or ulcers. MUSCULOSKELETAL:  no obvious deformities. NEURO:  alert and oriented x 3, no obvious focal deficit PSYCH:  normal affect  Noted hoariness, nasal tone, congestion on video   ASSESSMENT & PLAN:   1. Respiratory infection  - amoxicillin (AMOXIL) 500 MG capsule; Take 1 capsule (500 mg total) by mouth 2 (two) times daily for 10 days.  Dispense: 20 capsule; Refill: 0  2. Yeast vaginitis  - fluconazole (DIFLUCAN) 150 MG tablet; Take 1 tablet (150 mg total) by mouth daily.  Dispense: 2 tablet; Refill: 0   Time:   Today, I have spent 10 minutes with the patient with telehealth technology discussing the above problems.     Medication Adjustments/Labs and Tests Ordered: Current medicines are reviewed at length with the patient today.  Concerns regarding medicines are outlined above.   Tests Ordered: No orders of the defined types were placed in this encounter.   Medication Changes: No orders of the defined types were placed in this encounter.   Disposition:  Follow up 01/29/2020  Signed, 01/31/2020, NP  12/26/2019 4:11 PM     12/28/2019 [facility]

## 2020-01-16 ENCOUNTER — Encounter: Payer: BC Managed Care – PPO | Admitting: Family Medicine

## 2020-01-29 ENCOUNTER — Encounter: Payer: BC Managed Care – PPO | Admitting: Family Medicine

## 2020-02-04 ENCOUNTER — Encounter: Payer: Self-pay | Admitting: Family Medicine

## 2020-02-17 ENCOUNTER — Other Ambulatory Visit: Payer: Self-pay | Admitting: Family Medicine

## 2020-06-23 ENCOUNTER — Other Ambulatory Visit: Payer: Self-pay | Admitting: Family Medicine

## 2020-09-30 IMAGING — MG DIGITAL DIAGNOSTIC BILAT W/ TOMO W/ CAD
8 series · 8 of 24 positions shown · non-contrast
Comparison: None

CLINICAL DATA: 38-year-old patient presents for baseline mammogram.
Recently, she felt a lump in the inferior central right breast. She
no longer feels a lump. She has no specific concerns today.She has
had a bilateral breast reduction.

EXAM:
DIGITAL DIAGNOSTIC BILATERAL MAMMOGRAM WITH CAD AND TOMO

[L MLO synth-2D]
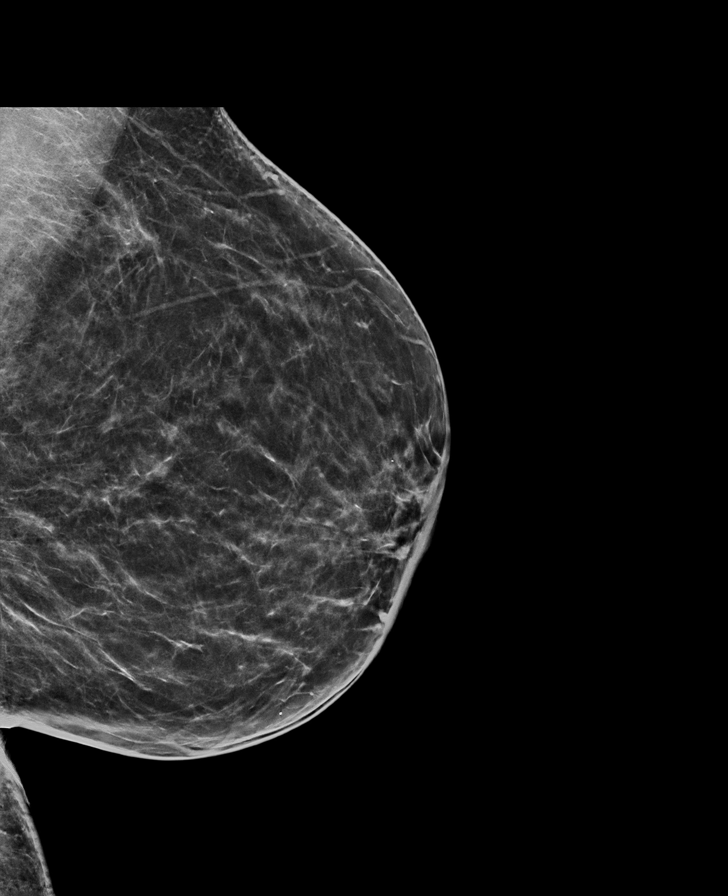

[L CC synth-2D]
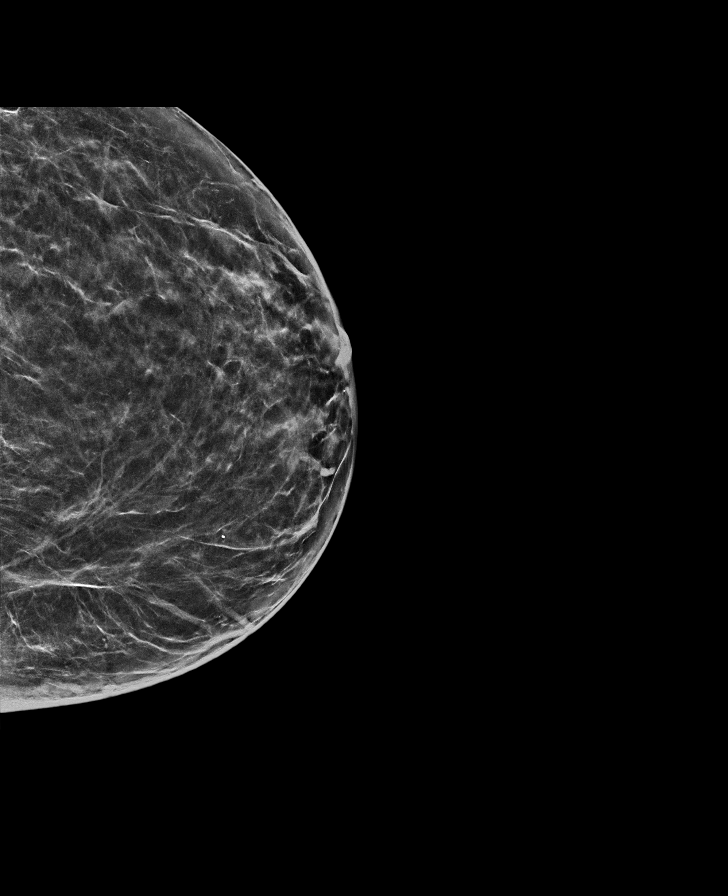

[R CC synth-2D]
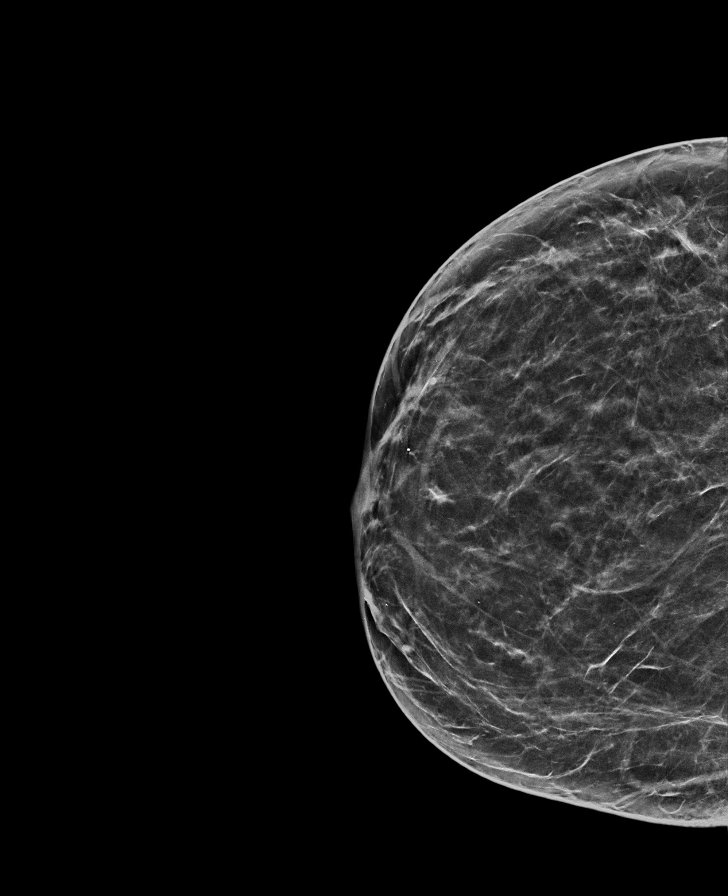

[R MLO synth-2D]
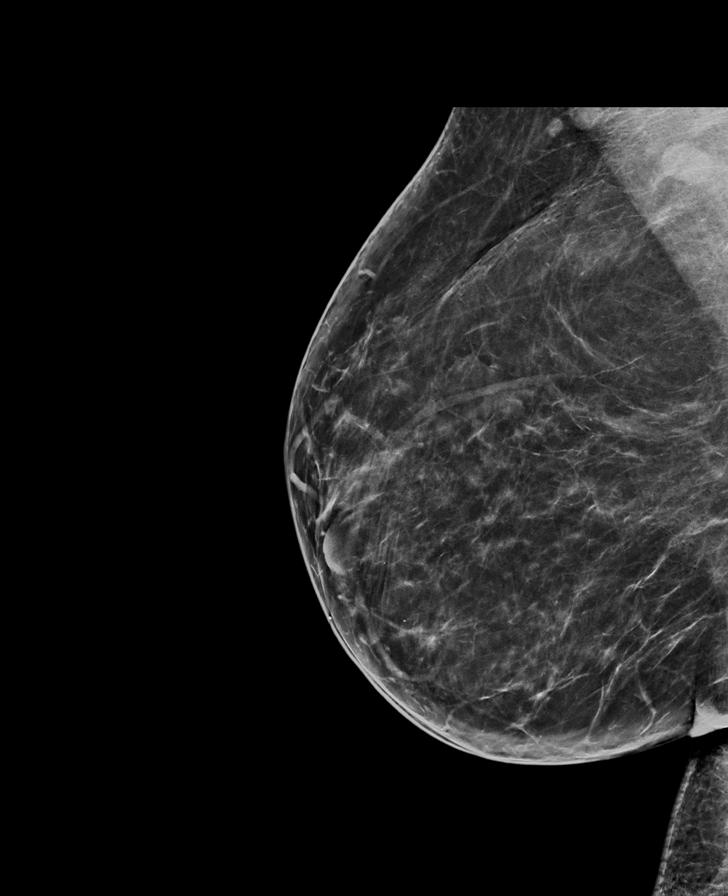

[L MLO tomo · tomo slice 36/71.0]
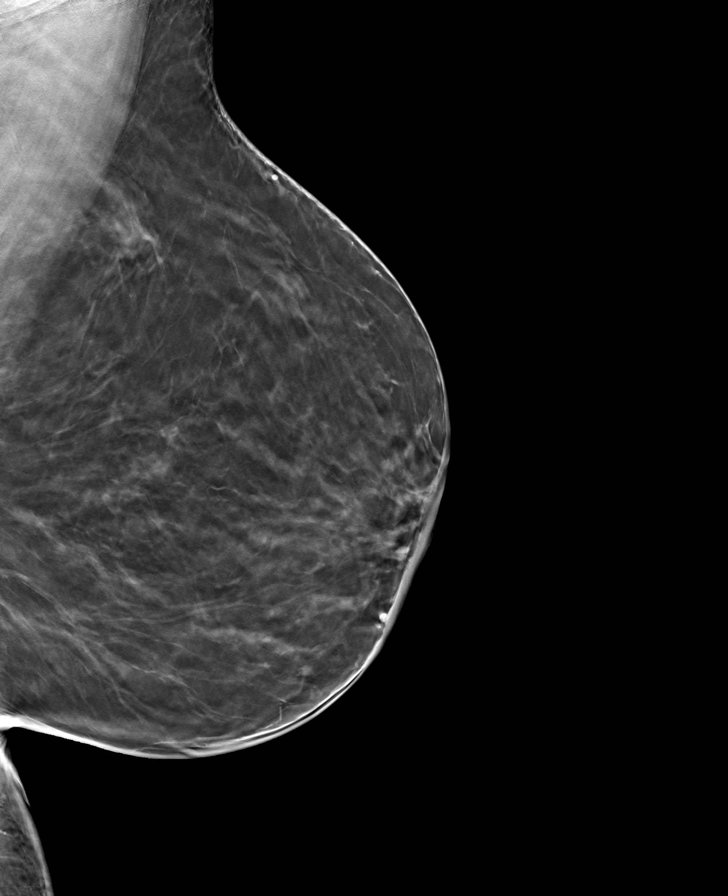

[R MLO tomo · tomo slice 39/77.0]
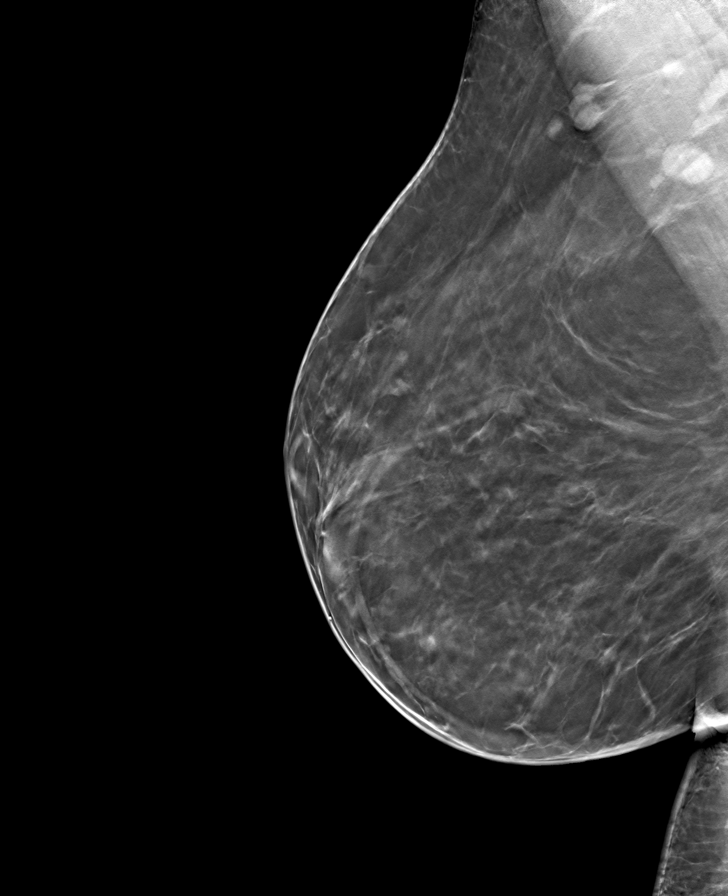

[R CC tomo · tomo slice 32/63.0]
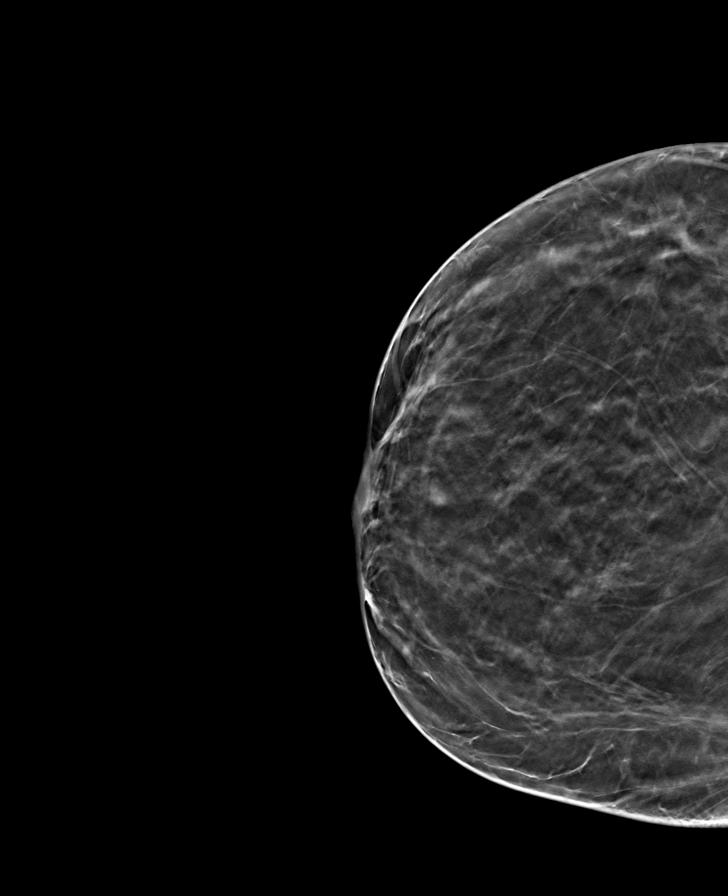

[L CC tomo · tomo slice 33/64.0]
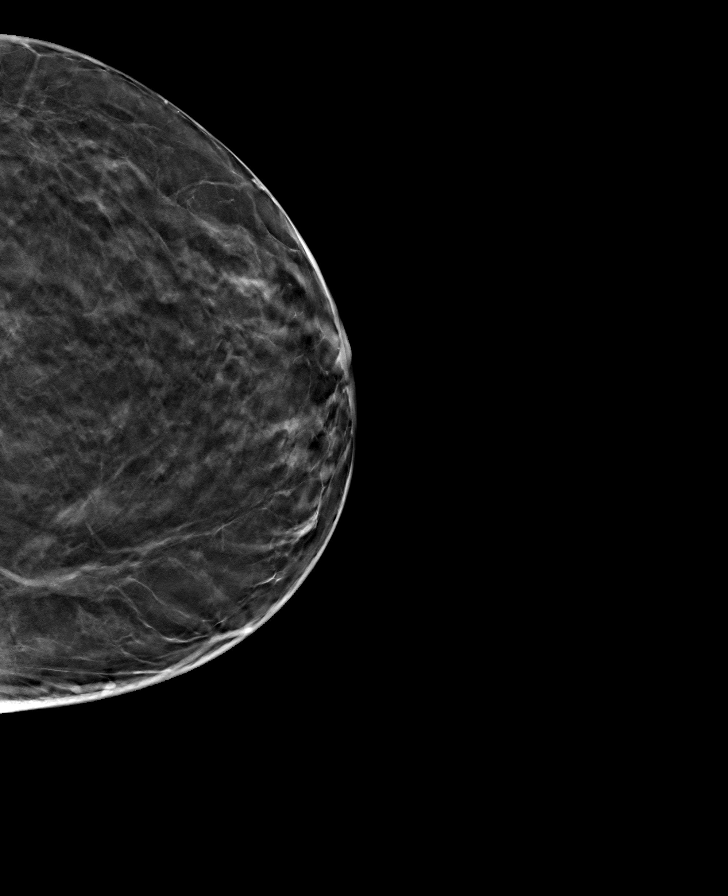

[8 of 24 positions shown; findings below may reference images not displayed]

ACR Breast Density Category b: There are scattered areas of
fibroglandular density.
FINDINGS: No mass, architectural distortion, or suspicious microcalcification
is identified to suggest malignancy in either breast.

Mammographic images were processed with CAD.
IMPRESSION: No evidence of malignancy in either breast. The patient is currently
asymptomatic, and is aware that she can return at any time for any
new areas of concern.

RECOMMENDATION:
Screening mammogram at age 41 unless there are persistent or
intervening clinical concerns. (Code:Q5-R-EOY)

I have discussed the findings and recommendations with the patient.
If applicable, a reminder letter will be sent to the patient
regarding the next appointment.

BI-RADS CATEGORY  1: Negative.

## 2021-03-27 ENCOUNTER — Telehealth: Payer: Self-pay | Admitting: Nurse Practitioner

## 2021-03-27 DIAGNOSIS — J0140 Acute pansinusitis, unspecified: Secondary | ICD-10-CM

## 2021-03-27 MED ORDER — AMOXICILLIN-POT CLAVULANATE 875-125 MG PO TABS: 1.0000 | ORAL_TABLET | Freq: Two times a day (BID) | ORAL | 0 refills | Status: AC

## 2021-03-27 NOTE — Progress Notes (Signed)

## 2021-04-23 ENCOUNTER — Telehealth: Payer: Self-pay

## 2021-04-23 NOTE — Telephone Encounter (Signed)
Patient called asking if she can get blood work order before her follow up appt Nov. 10th   Her CPE is not until January.

## 2021-04-26 ENCOUNTER — Other Ambulatory Visit: Payer: Self-pay

## 2021-04-26 DIAGNOSIS — E785 Hyperlipidemia, unspecified: Secondary | ICD-10-CM

## 2021-04-26 DIAGNOSIS — E559 Vitamin D deficiency, unspecified: Secondary | ICD-10-CM

## 2021-04-26 DIAGNOSIS — R7302 Impaired glucose tolerance (oral): Secondary | ICD-10-CM

## 2021-04-26 NOTE — Telephone Encounter (Signed)
LVM that labs were ordered and she could do anytime before her visit

## 2021-05-13 ENCOUNTER — Ambulatory Visit: Payer: Managed Care, Other (non HMO) | Admitting: Family Medicine

## 2021-05-13 ENCOUNTER — Other Ambulatory Visit: Payer: Self-pay

## 2021-05-13 ENCOUNTER — Encounter: Payer: Self-pay | Admitting: Family Medicine

## 2021-05-13 VITALS — BP 124/82 | HR 85 | Resp 16 | Ht 65.0 in | Wt 246.1 lb

## 2021-05-13 DIAGNOSIS — E559 Vitamin D deficiency, unspecified: Secondary | ICD-10-CM

## 2021-05-13 DIAGNOSIS — K219 Gastro-esophageal reflux disease without esophagitis: Secondary | ICD-10-CM

## 2021-05-13 DIAGNOSIS — G43009 Migraine without aura, not intractable, without status migrainosus: Secondary | ICD-10-CM | POA: Diagnosis not present

## 2021-05-13 DIAGNOSIS — K824 Cholesterolosis of gallbladder: Secondary | ICD-10-CM

## 2021-05-13 DIAGNOSIS — R7302 Impaired glucose tolerance (oral): Secondary | ICD-10-CM

## 2021-05-13 DIAGNOSIS — R140 Abdominal distension (gaseous): Secondary | ICD-10-CM

## 2021-05-13 DIAGNOSIS — Z1159 Encounter for screening for other viral diseases: Secondary | ICD-10-CM

## 2021-05-13 DIAGNOSIS — E785 Hyperlipidemia, unspecified: Secondary | ICD-10-CM | POA: Diagnosis not present

## 2021-05-13 DIAGNOSIS — R110 Nausea: Secondary | ICD-10-CM

## 2021-05-13 DIAGNOSIS — D539 Nutritional anemia, unspecified: Secondary | ICD-10-CM

## 2021-05-13 DIAGNOSIS — Z1231 Encounter for screening mammogram for malignant neoplasm of breast: Secondary | ICD-10-CM

## 2021-05-13 NOTE — Patient Instructions (Addendum)
F/U in 5 months, call if you need me before  You are referred to [facility] in [place] for  lifestyle help with obesity ([phone])  Please schedule mammogram at Breast center at checkout  Fasting cBC, lipid, cmp and EGFR, tSH, hBA1c, and vit D today and B12 level, hep c screen  It is important that you exercise regularly at least 30 minutes 5 times a week. If you develop chest pain, have severe difficulty breathing, or feel very tired, stop exercising immediately and seek medical attention   Think about what you will eat, plan ahead. Choose " clean, green, fresh or frozen" over canned, processed or packaged foods which are more sugary, salty and fatty. 70 to 75% of food eaten should be vegetables and fruit. Three meals at set times with snacks allowed between meals, but they must be fruit or vegetables. Aim to eat over a 12 hour period , example 7 am to 7 pm, and STOP after  your last meal of the day. Drink water,generally about 64 ounces per day, no other drink is as healthy. Fruit juice is best enjoyed in a healthy way, by EATING the fruit.   Aim for 1200 to 1500 cal /day  Aim to eat between 7 am and 7 pm  You are referred to weight management  Tasia Catchings are referred for [place] of gallbladder  Please schedule pap  Need covid booster  Thanks for choosing [facility], we consider it a privelige to serve you.

## 2021-05-14 ENCOUNTER — Encounter: Payer: Self-pay | Admitting: Family Medicine

## 2021-05-14 LAB — LIPID PANEL
Chol/HDL Ratio: 4.1 ratio (ref 0.0–4.4)
Cholesterol, Total: 179 mg/dL (ref 100–199)
HDL: 44 mg/dL (ref >39)
LDL Chol Calc (NIH): 111 mg/dL — ABNORMAL HIGH (ref 0–99)
Triglycerides: 133 mg/dL (ref 0–149)
VLDL Cholesterol Cal: 24 mg/dL (ref 5–40)

## 2021-05-14 LAB — CMP14+EGFR
ALT: 15 IU/L (ref 0–32)
AST: 15 IU/L (ref 0–40)
Albumin/Globulin Ratio: 1.7 (ref 1.2–2.2)
Albumin: 4.2 g/dL (ref 3.8–4.8)
Alkaline Phosphatase: 111 IU/L (ref 44–121)
BUN/Creatinine Ratio: 13 (ref 9–23)
BUN: 11 mg/dL (ref 6–20)
Bilirubin Total: 0.4 mg/dL (ref 0.0–1.2)
CO2: 24 mmol/L (ref 20–29)
Calcium: 9.7 mg/dL (ref 8.7–10.2)
Chloride: 102 mmol/L (ref 96–106)
Creatinine, Ser: 0.85 mg/dL (ref 0.57–1.00)
Globulin, Total: 2.5 g/dL (ref 1.5–4.5)
Glucose: 82 mg/dL (ref 70–99)
Potassium: 4.7 mmol/L (ref 3.5–5.2)
Sodium: 138 mmol/L (ref 134–144)
Total Protein: 6.7 g/dL (ref 6.0–8.5)
eGFR: 89 mL/min/{1.73_m2} (ref >59)

## 2021-05-14 LAB — HEPATITIS C ANTIBODY: Hep C Virus Ab: <0.1 s/co ratio (ref 0.0–0.9)

## 2021-05-14 LAB — CBC
Hematocrit: 39.3 % (ref 34.0–46.6)
Hemoglobin: 12.5 g/dL (ref 11.1–15.9)
MCH: 24.6 pg — ABNORMAL LOW (ref 26.6–33.0)
MCHC: 31.8 g/dL (ref 31.5–35.7)
MCV: 77 fL — ABNORMAL LOW (ref 79–97)
Platelets: 374 10*3/uL (ref 150–450)
RBC: 5.09 x10E6/uL (ref 3.77–5.28)
RDW: 14.4 % (ref 11.7–15.4)
WBC: 8.7 10*3/uL (ref 3.4–10.8)

## 2021-05-14 LAB — VITAMIN B12: Vitamin B-12: 676 pg/mL (ref 232–1245)

## 2021-05-14 LAB — TSH: TSH: 1.2 u[IU]/mL (ref 0.450–4.500)

## 2021-05-14 LAB — HEMOGLOBIN A1C
Est. average glucose Bld gHb Est-mCnc: 117 mg/dL
Hgb A1c MFr Bld: 5.7 % — ABNORMAL HIGH (ref 4.8–5.6)

## 2021-05-14 LAB — VITAMIN D 25 HYDROXY (VIT D DEFICIENCY, FRACTURES): Vit D, 25-Hydroxy: 26.2 ng/mL — ABNORMAL LOW (ref 30.0–100.0)

## 2021-05-14 MED ORDER — PHENTERMINE HCL 37.5 MG PO TABS: ORAL_TABLET | ORAL | 2 refills | Status: DC

## 2021-05-16 ENCOUNTER — Encounter: Payer: Self-pay | Admitting: Family Medicine

## 2021-05-16 NOTE — Assessment & Plan Note (Signed)
Patient educated about the importance of limiting  Carbohydrate intake , the need to commit to daily physical activity for a minimum of 30 minutes , and to commit weight loss. The fact that changes in all these areas will reduce or eliminate all together the development of diabetes is stressed Deteriorated .   Diabetic Labs Latest Ref Rng & Units 05/13/2021 01/08/2019 10/19/2017 07/11/2016 05/18/2015  HbA1c 4.8 - 5.6 % 5.7(H) - 5.4 5.2 5.7(H)  Chol 100 - 199 mg/dL 754 492 010 071 219  HDL >39 mg/dL 44 75(O) 83(G) 54(D) 82(M)  Calc LDL 0 - 99 mg/dL 415(A) 73 71 86 91  Triglycerides 0 - 149 mg/dL 309 407(W) 808 811 031  Creatinine 0.57 - 1.00 mg/dL 5.94 5.85 9.29 2.44 6.28   BP/Weight 05/13/2021 12/26/2019 06/17/2019 01/07/2019 08/10/2018 10/19/2017 10/15/2017  Systolic BP 124 126 126 120 131 108 127  Diastolic BP 82 80 80 82 72 80 73  Wt. (Lbs) 246.08 219 219.12 238 - 233 -  BMI 40.95 36.44 36.46 39 - 38.77 -   No flowsheet data found.

## 2021-05-16 NOTE — Assessment & Plan Note (Signed)
Controlled, no change in medication  

## 2021-05-16 NOTE — Assessment & Plan Note (Addendum)
  Patient re-educated about  the importance of commitment to a  minimum of 150 minutes of exercise per week as able.  The importance of healthy food choices with portion control discussed, as well as eating regularly and within a 12 hour window most days. The need to choose "clean , green" food 50 to 75% of the time is discussed, as well as to make water the primary drink and set a goal of 64 ounces water daily.    Weight /BMI 05/13/2021 12/26/2019 06/17/2019  WEIGHT 246 lb 1.3 oz 219 lb 219 lb 1.9 oz  HEIGHT 5\' 5"  5\' 5"  5\' 5"   BMI 40.95 kg/m2 36.44 kg/m2 36.46 kg/m2   Referred to weight management , and half phentermine daily prescribed

## 2021-05-16 NOTE — Progress Notes (Signed)
CHRYSA RAMPY     MRN: [id number]      DOB: [birth year]   HPI Shawna Gonzales is here for follow up and re-evaluation of chronic medical conditions, medication management and review of any available recent lab and radiology data.  Preventive health is updated, specifically  Cancer screening and Immunization.   Requests referral to weight loss/ management center, disturbed about the fact that she is the heaviest she has ever been and wants to work on this.   ROS Denies recent fever or chills. Denies sinus pressure, nasal congestion, ear pain or sore throat. Denies chest congestion, productive cough or wheezing. Denies chest pains, palpitations and leg swelling Denies abdominal pain, nausea, vomiting,diarrhea or constipation.   Denies dysuria, frequency, hesitancy or incontinence. Denies joint pain, swelling and limitation in mobility. Denies headaches, seizures, numbness, or tingling. Denies depression, anxiety or insomnia. Denies skin break down or rash.   PE  BP 124/82   Pulse 85   Resp 16   Ht 5\' 5"  (1.651 m)   Wt 246 lb 1.3 oz (111.6 kg)   SpO2 96%   BMI 40.95 kg/m   Patient alert and oriented and in no cardiopulmonary distress.  HEENT: No facial asymmetry, EOMI,     Neck supple .  Chest: Clear to auscultation bilaterally.  CVS: S1, S2 no murmurs, no S3.Regular rate.  ABD: Soft non tender.   Ext: No edema  MS: Adequate ROM spine, shoulders, hips and knees.  Skin: Intact, no ulcerations or rash noted.  Psych: Good eye contact, normal affect. Memory intact not anxious or depressed appearing.  CNS: CN 2-12 intact, power,  normal throughout.no focal deficits noted.   Assessment & Plan  Morbid obesity [facility])  Patient re-educated about  the importance of commitment to a  minimum of 150 minutes of exercise per week as able.  The importance of healthy food choices with portion control discussed, as well as eating regularly and within a 12 hour window most  days. The need to choose "clean , green" food 50 to 75% of the time is discussed, as well as to make water the primary drink and set a goal of 64 ounces water daily.    Weight /BMI 05/13/2021 12/26/2019 06/17/2019  WEIGHT 246 lb 1.3 oz 219 lb 219 lb 1.9 oz  HEIGHT 5\' 5"  5\' 5"  5\' 5"   BMI 40.95 kg/m2 36.44 kg/m2 36.46 kg/m2   Referred to weight management , and half phentermine daily prescribed  Migraine Controlled, no change in medication   Dyslipidemia Hyperlipidemia:Low fat diet discussed and encouraged.   Lipid Panel  Lab Results  Component Value Date   CHOL 179 05/13/2021   HDL 44 05/13/2021   LDLCALC 111 (H) 05/13/2021   TRIG 133 05/13/2021   CHOLHDL 4.1 05/13/2021     Needs to reduce fried and fatty foods   Gall bladder polyp rept 13/04/2021 to re evaluate  Impaired glucose tolerance Patient educated about the importance of limiting  Carbohydrate intake , the need to commit to daily physical activity for a minimum of 30 minutes , and to commit weight loss. The fact that changes in all these areas will reduce or eliminate all together the development of diabetes is stressed Deteriorated .   Diabetic Labs Latest Ref Rng & Units 05/13/2021 01/08/2019 10/19/2017 07/11/2016 05/18/2015  HbA1c 4.8 - 5.6 % 5.7(H) - 5.4 5.2 5.7(H)  Chol 100 - 199 mg/dL 03/11/2019 10/21/2017 09/08/2016 05/20/2015 751  HDL >39 mg/dL 44 025) 852) 778)  43(L)  Calc LDL 0 - 99 mg/dL 196(Q) 73 71 86 91  Triglycerides 0 - 149 mg/dL 229 798(X) 211 941 740  Creatinine 0.57 - 1.00 mg/dL 8.14 4.81 8.56 3.14 9.70   BP/Weight 05/13/2021 12/26/2019 06/17/2019 01/07/2019 08/10/2018 10/19/2017 10/15/2017  Systolic BP 124 126 126 120 131 108 127  Diastolic BP 82 80 80 82 72 80 73  Wt. (Lbs) 246.08 219 219.12 238 - 233 -  BMI 40.95 36.44 36.46 39 - 38.77 -   No flowsheet data found.    GERD (gastroesophageal reflux disease) Controlled, no change in medication

## 2021-05-16 NOTE — Assessment & Plan Note (Signed)
Hyperlipidemia:Low fat diet discussed and encouraged.   Lipid Panel  Lab Results  Component Value Date   CHOL 179 05/13/2021   HDL 44 05/13/2021   LDLCALC 111 (H) 05/13/2021   TRIG 133 05/13/2021   CHOLHDL 4.1 05/13/2021     Needs to reduce fried and fatty foods

## 2021-05-16 NOTE — Assessment & Plan Note (Signed)
rept [place] to re evaluate

## 2021-05-17 ENCOUNTER — Other Ambulatory Visit: Payer: Self-pay | Admitting: Family Medicine

## 2021-05-17 MED ORDER — ROSUVASTATIN CALCIUM 5 MG PO TABS: 5.0000 mg | ORAL_TABLET | Freq: Every day | ORAL | 1 refills | Status: DC

## 2021-05-18 LAB — SPECIMEN STATUS REPORT

## 2021-05-18 LAB — IRON: Iron: 44 ug/dL (ref 27–159)

## 2021-05-18 LAB — FERRITIN: Ferritin: 13 ng/mL — ABNORMAL LOW (ref 15–150)

## 2021-05-25 ENCOUNTER — Ambulatory Visit (HOSPITAL_COMMUNITY): Payer: Managed Care, Other (non HMO)

## 2021-06-04 ENCOUNTER — Encounter (HOSPITAL_COMMUNITY): Payer: Self-pay

## 2021-06-04 ENCOUNTER — Ambulatory Visit (HOSPITAL_COMMUNITY): Admission: RE | Admit: 2021-06-04 | Payer: Managed Care, Other (non HMO) | Source: Ambulatory Visit

## 2021-07-07 ENCOUNTER — Telehealth: Payer: Managed Care, Other (non HMO) | Admitting: Physician Assistant

## 2021-07-07 DIAGNOSIS — J019 Acute sinusitis, unspecified: Secondary | ICD-10-CM

## 2021-07-07 DIAGNOSIS — B9789 Other viral agents as the cause of diseases classified elsewhere: Secondary | ICD-10-CM | POA: Diagnosis not present

## 2021-07-07 MED ORDER — AZELASTINE HCL 0.1 % NA SOLN: 1.0000 | Freq: Two times a day (BID) | NASAL | 0 refills | Status: DC

## 2021-07-07 NOTE — Progress Notes (Signed)
I have spent 5 minutes in review of e-visit questionnaire, review and updating patient chart, medical decision making and response to patient.   Lindee Leason Cody Diannah Rindfleisch, PA-C    

## 2021-07-07 NOTE — Progress Notes (Signed)
E-Visit for Sinus Problems  We are sorry that you are not feeling well.  Here is how we plan to help!  Based on what you have shared with me it looks like you have sinusitis.  Sinusitis is inflammation and infection in the sinus cavities of the head.  Based on your presentation I believe you most likely have Acute Viral Sinusitis.This is an infection most likely caused by a virus. There is not specific treatment for viral sinusitis other than to help you with the symptoms until the infection runs its course.  You may use an oral decongestant such as Mucinex D or if you have glaucoma or high blood pressure use plain Mucinex. Saline nasal spray help and can safely be used as often as needed for congestion, I have prescribed: Azelastine nasal spray 2 sprays in each nostril twice a day. Start this along with current nasal steroid spray (I have Flonase listed on your chart)  Some authorities believe that zinc sprays or the use of Echinacea may shorten the course of your symptoms.  Sinus infections are not as easily transmitted as other respiratory infection, however we still recommend that you avoid close contact with loved ones, especially the very young and elderly.  Remember to wash your hands thoroughly throughout the day as this is the number one way to prevent the spread of infection!  Home Care: Only take medications as instructed by your medical team. Do not take these medications with alcohol. A steam or ultrasonic humidifier can help congestion.  You can place a towel over your head and breathe in the steam from hot water coming from a faucet. Avoid close contacts especially the very young and the elderly. Cover your mouth when you cough or sneeze. Always remember to wash your hands.  Get Help Right Away If: You develop worsening fever or sinus pain. You develop a severe head ache or visual changes. Your symptoms persist after you have completed your treatment plan.  Make sure  you Understand these instructions. Will watch your condition. Will get help right away if you are not doing well or get worse.   Thank you for choosing an e-visit.  Your e-visit answers were reviewed by a board certified advanced clinical practitioner to complete your personal care plan. Depending upon the condition, your plan could have included both over the counter or prescription medications.  Please review your pharmacy choice. Make sure the pharmacy is open so you can pick up prescription now. If there is a problem, you may contact your provider through [employer] and have the prescription routed to another pharmacy.  Your safety is important to [place]. If you have drug allergies check your prescription carefully.   For the next 24 hours you can use MyChart to ask questions about today's visit, request a non-urgent call back, or ask for a work or school excuse. You will get an email in the next two days asking about your experience. I hope that your e-visit has been valuable and will speed your recovery.

## 2021-07-13 ENCOUNTER — Encounter: Payer: Self-pay | Admitting: Family Medicine

## 2021-08-17 ENCOUNTER — Telehealth: Payer: Managed Care, Other (non HMO) | Admitting: Physician Assistant

## 2021-08-17 DIAGNOSIS — R3989 Other symptoms and signs involving the genitourinary system: Secondary | ICD-10-CM | POA: Diagnosis not present

## 2021-08-17 MED ORDER — CEPHALEXIN 500 MG PO CAPS: 500.0000 mg | ORAL_CAPSULE | Freq: Two times a day (BID) | ORAL | 0 refills | Status: AC

## 2021-08-17 MED ORDER — FLUCONAZOLE 150 MG PO TABS
150.0000 mg | ORAL_TABLET | Freq: Once | ORAL | 0 refills | Status: AC
Start: 2021-08-17 — End: 2021-08-17

## 2021-08-17 NOTE — Addendum Note (Signed)
Addended by: Waldon Merl on: 08/17/2021 07:47 AM   Modules accepted: Orders

## 2021-08-17 NOTE — Progress Notes (Signed)

## 2021-08-17 NOTE — Progress Notes (Signed)
I have spent 5 minutes in review of e-visit questionnaire, review and updating patient chart, medical decision making and response to patient.   Orren Pietsch Cody Matelyn Antonelli, PA-C    

## 2021-09-01 ENCOUNTER — Encounter: Payer: Self-pay | Admitting: Family Medicine

## 2021-09-10 ENCOUNTER — Other Ambulatory Visit: Payer: Self-pay | Admitting: Family Medicine

## 2021-09-10 ENCOUNTER — Encounter: Payer: Self-pay | Admitting: Family Medicine

## 2021-09-10 DIAGNOSIS — K824 Cholesterolosis of gallbladder: Secondary | ICD-10-CM

## 2021-09-10 NOTE — Telephone Encounter (Signed)
What do I need to order??  

## 2021-09-20 ENCOUNTER — Other Ambulatory Visit: Payer: Self-pay

## 2021-09-20 ENCOUNTER — Ambulatory Visit (HOSPITAL_COMMUNITY)
Admission: RE | Admit: 2021-09-20 | Discharge: 2021-09-20 | Disposition: A | Discharge status: Home / Self Care | Payer: Managed Care, Other (non HMO) | Source: Ambulatory Visit | Attending: Family Medicine | Admitting: Family Medicine

## 2021-09-20 DIAGNOSIS — K824 Cholesterolosis of gallbladder: Secondary | ICD-10-CM | POA: Insufficient documentation

## 2021-09-23 ENCOUNTER — Encounter: Payer: Self-pay | Admitting: Family Medicine

## 2021-09-23 NOTE — Telephone Encounter (Signed)
Left message to call office to schedule an appointment.

## 2021-10-06 ENCOUNTER — Encounter: Payer: Self-pay | Admitting: Family Medicine

## 2021-10-06 ENCOUNTER — Ambulatory Visit (INDEPENDENT_AMBULATORY_CARE_PROVIDER_SITE_OTHER): Payer: Managed Care, Other (non HMO) | Admitting: Family Medicine

## 2021-10-06 DIAGNOSIS — K8010 Calculus of gallbladder with chronic cholecystitis without obstruction: Secondary | ICD-10-CM | POA: Diagnosis not present

## 2021-10-06 DIAGNOSIS — K8020 Calculus of gallbladder without cholecystitis without obstruction: Secondary | ICD-10-CM | POA: Diagnosis not present

## 2021-10-06 NOTE — Progress Notes (Signed)
Virtual Visit via Telephone Note ? ?I connected with Suszanne Conners on 10/06/21 at  4:40 PM EDT by telephone and verified that I am speaking with the correct person using two identifiers. ? ?Location: ?Patient: work ar t break ?Provider: office ?  ?I discussed the limitations, risks, security and privacy concerns of performing an evaluation and management service by telephone and the availability of in person appointments. I also discussed with the patient that there may be a patient responsible charge related to this service. The patient expressed understanding and agreed to proceed. ? ? ?History of Present Illness: ?Increased  episodes of naisea approx 3 days oper week  in the pastin past 6 months, no vomit, no fever, recently noticuing upper abdominalpain, last week sharp rUQ pain ?  ?Observations/Objective: ? ? ?Assessment and Plan: ? ? ?Follow Up Instructions: ? ?  ?I discussed the assessment and treatment plan with the patient. The patient was provided an opportunity to ask questions and all were answered. The patient agreed with the plan and demonstrated an understanding of the instructions. ?  ?The patient was advised to call back or seek an in-person evaluation if the symptoms worsen or if the condition fails to improve as anticipated. ? ?I provided 8 minutes of non-face-to-face time during this encounter. ? ? ?Syliva Overman, MD ? ?

## 2021-10-06 NOTE — Patient Instructions (Signed)
F/U as before, call if you need me sooner ? ?You are referred urgently to surgeon since t you report pain and increased nausea from your gallstones ? ?If your symptoms worsen, or you develop fever or chills , you need to go to the ED ? ?We will contact you by leaving a message ion your cell re your surgery appointment ? ?Thanks for choosing [facility], we consider it a privelige to serve you. ? ?

## 2021-10-06 NOTE — Assessment & Plan Note (Signed)
Symptomatic with pain and nausea on avg 3 times / week x 6 monthss, refe to surgery asap. Discussed warning symptoms of obstruction or infection and the need to go to the eD for evaluation ?

## 2021-10-06 NOTE — Progress Notes (Signed)
?  Virtual Visit via Telephone Note ? ?I connected with Shawna Gonzales on 10/06/21 at  4:40 PM EDT by telephone and verified that I am speaking with the correct person using two identifiers. ? ?Location: ?Patient: work at break ?Provider: office ?  ?I discussed the limitations, risks, security and privacy concerns of performing an evaluation and management service by telephone and the availability of in person appointments. I also discussed with the patient that there may be a patient responsible charge related to this service. The patient expressed understanding and agreed to proceed. ? ? ?History of Present Illness: ? ? 6 month h/o increased abdomional pain and nausea on avg 3 times per week, denies fever, chills, vomiting. Has established dx of cholelithtiasis from recent US ?Observations/Objective: ?There were no vitals taken for this visit. ? Good communication with no confusion and intact memory. ?Alert and oriented x 3 ?No signs of respiratory distress during speech ? ? ?Assessment and Plan: ?Multiple gallstones ?Symptomatic with pain and nausea on avg 3 times / week x 6 monthss, refe to surgery asap. Discussed warning symptoms of obstruction or infection and the need to go to the eD for evaluation ? ? ?Follow Up Instructions: ? ?  ?I discussed the assessment and treatment plan with the patient. The patient was provided an opportunity to ask questions and all were answered. The patient agreed with the plan and demonstrated an understanding of the instructions. ?  ?The patient was advised to call back or seek an in-person evaluation if the symptoms worsen or if the condition fails to improve as anticipated. ? ?I provided 8 minutes of non-face-to-face time during this encounter. ? ? ?Syliva Overman, MD ? ?

## 2021-10-07 ENCOUNTER — Telehealth: Payer: Self-pay | Admitting: Family Medicine

## 2021-10-07 ENCOUNTER — Ambulatory Visit: Payer: Managed Care, Other (non HMO) | Admitting: Surgery

## 2021-10-07 NOTE — Telephone Encounter (Signed)
Faxed to them

## 2021-10-07 NOTE — Telephone Encounter (Signed)
Patient called in regard to recent referral to [facility]. ? ?Office needs ultrasound imaging sent over  ? ?Fax [phone] ? ?Can call patient back  ?

## 2021-10-11 ENCOUNTER — Telehealth: Payer: Self-pay | Admitting: Family Medicine

## 2021-10-11 NOTE — Telephone Encounter (Signed)
Sandy with [facility] called in on patient behalf. * Last tele* ? ?Received the patient report but did not have imaging report for gallbladder. ? ?[facility] needs the imaging report of patient gallbladder.  ? ?Call back in fo  ? ?Sandy w. [facility]  ?[phone] ? ?

## 2021-10-12 HISTORY — PX: GALLBLADDER SURGERY: SHX652

## 2021-10-12 NOTE — Telephone Encounter (Signed)
Faxed

## 2021-10-15 ENCOUNTER — Ambulatory Visit: Payer: Managed Care, Other (non HMO) | Admitting: Family Medicine

## 2021-10-19 ENCOUNTER — Encounter: Payer: Self-pay | Admitting: Family Medicine

## 2021-11-04 ENCOUNTER — Encounter: Payer: Managed Care, Other (non HMO) | Admitting: Family Medicine

## 2021-12-02 ENCOUNTER — Encounter: Payer: Managed Care, Other (non HMO) | Admitting: Family Medicine

## 2022-01-25 ENCOUNTER — Encounter: Payer: Managed Care, Other (non HMO) | Admitting: Family Medicine

## 2022-02-16 ENCOUNTER — Encounter: Payer: Self-pay | Admitting: Family Medicine

## 2022-02-16 ENCOUNTER — Ambulatory Visit (INDEPENDENT_AMBULATORY_CARE_PROVIDER_SITE_OTHER): Payer: Managed Care, Other (non HMO) | Admitting: Family Medicine

## 2022-02-16 VITALS — BP 137/87 | HR 81 | Resp 16 | Ht 65.5 in | Wt 225.8 lb

## 2022-02-16 DIAGNOSIS — E559 Vitamin D deficiency, unspecified: Secondary | ICD-10-CM | POA: Diagnosis not present

## 2022-02-16 DIAGNOSIS — Z0000 Encounter for general adult medical examination without abnormal findings: Secondary | ICD-10-CM

## 2022-02-16 DIAGNOSIS — E785 Hyperlipidemia, unspecified: Secondary | ICD-10-CM | POA: Diagnosis not present

## 2022-02-16 DIAGNOSIS — D539 Nutritional anemia, unspecified: Secondary | ICD-10-CM

## 2022-02-16 DIAGNOSIS — R7302 Impaired glucose tolerance (oral): Secondary | ICD-10-CM | POA: Diagnosis not present

## 2022-02-16 MED ORDER — IBUPROFEN 400 MG PO TABS
ORAL_TABLET | ORAL | 1 refills | Status: DC
Start: 2022-02-16 — End: 2022-08-17

## 2022-02-16 MED ORDER — SUMATRIPTAN SUCCINATE 100 MG PO TABS: 100.0000 mg | ORAL_TABLET | ORAL | 2 refills | Status: DC | PRN

## 2022-02-16 NOTE — Assessment & Plan Note (Signed)

## 2022-02-16 NOTE — Patient Instructions (Signed)
F/u in 6 months, call if you nneed me sooner  CONGRATS on excellent health habits  Fasting cBC, iron, ferritin, lipid, cmp and EGFr, HBA1C, TSH and vit D as soon as possible  Weight loss goal of 12 to 15 pounds  Increase exercise duration to 45 minutes even 3 days/ week , if able   Thanks for choosing [facility], we consider it a privelige to serve you.

## 2022-02-17 ENCOUNTER — Encounter: Payer: Self-pay | Admitting: Family Medicine

## 2022-02-17 NOTE — Progress Notes (Signed)
    Shawna Gonzales     MRN: [id number]      DOB: [birth year]  HPI: Patient is in for annual physical exam. No other health concerns are expressed or addressed at the visit. Recent labs,  are reviewed. Immunization is reviewed , and  updated if needed.   PE: BP 137/87   Pulse 81   Resp 16   Ht 5' 5.5" (1.664 m)   Wt 225 lb 12.8 oz (102.4 kg)   LMP 02/09/2022 (Exact Date)   SpO2 96%   BMI 37.00 kg/m   Pleasant  female, alert and oriented x 3, in no cardio-pulmonary distress. Afebrile. HEENT No facial trauma or asymetry. Sinuses non tender.  Extra occullar muscles intact.. External ears normal, . Neck: supple, no adenopathy,JVD or thyromegaly.No bruits.  Chest: Clear to ascultation bilaterally.No crackles or wheezes. Non tender to palpation  Breast: Not examined , has mammogram today  Cardiovascular system; Heart sounds normal,  S1 and  S2 ,no S3.  No murmur, or thrill.  Peripheral pulses normal.  Abdomen: Soft, non tender, no organomegaly or masses. No bruits. Bowel sounds normal. No guarding, tenderness or rebound.   GU: Not examined , exam by [facility]   Musculoskeletal exam: Full ROM of spine, hips , shoulders and knees. No deformity ,swelling or crepitus noted. No muscle wasting or atrophy.   Neurologic: Cranial nerves 2 to 12 intact. Power, tone ,sensation  normal throughout. No disturbance in gait. No tremor.  Skin: Intact, no ulceration, erythema , scaling or rash noted. Pigmentation normal throughout  Psych; Normal mood and affect. Judgement and concentration normal   Assessment & Plan:  Annual physical exam Annual exam as documented. Counseling done  re healthy lifestyle involving commitment to 150 minutes exercise per week, heart healthy diet, and attaining healthy weight.The importance of adequate sleep also discussed. Regular seat belt use and home safety, is also discussed. Changes in health habits are decided on by the patient with  goals and time frames  set for achieving them. Immunization and cancer screening needs are specifically addressed at this visit.

## 2022-02-20 ENCOUNTER — Encounter: Payer: Self-pay | Admitting: Family Medicine

## 2022-02-22 ENCOUNTER — Telehealth: Payer: Self-pay | Admitting: Family Medicine

## 2022-02-22 NOTE — Telephone Encounter (Signed)
Form faxed back.

## 2022-02-22 NOTE — Telephone Encounter (Signed)
Shawna Gonzales with [facility] called stating they faxed over a form this morning. States pt appt is tomorrow at 8:30. Can you please look into this?

## 2022-02-22 NOTE — Telephone Encounter (Signed)
Spoke with Julie

## 2022-02-22 NOTE — Telephone Encounter (Signed)
Call back number is [phone]

## 2022-05-24 HISTORY — PX: OTHER SURGICAL HISTORY: SHX169

## 2022-07-25 ENCOUNTER — Encounter: Payer: Self-pay | Admitting: Family Medicine

## 2022-07-26 ENCOUNTER — Other Ambulatory Visit: Payer: Self-pay

## 2022-07-26 DIAGNOSIS — R7302 Impaired glucose tolerance (oral): Secondary | ICD-10-CM

## 2022-07-26 DIAGNOSIS — E559 Vitamin D deficiency, unspecified: Secondary | ICD-10-CM

## 2022-07-26 DIAGNOSIS — E785 Hyperlipidemia, unspecified: Secondary | ICD-10-CM

## 2022-07-31 ENCOUNTER — Telehealth: Payer: Managed Care, Other (non HMO) | Admitting: Urgent Care

## 2022-07-31 DIAGNOSIS — H60502 Unspecified acute noninfective otitis externa, left ear: Secondary | ICD-10-CM

## 2022-07-31 MED ORDER — CIPROFLOXACIN-DEXAMETHASONE 0.3-0.1 % OT SUSP: 3.0000 [drp] | Freq: Two times a day (BID) | OTIC | 0 refills | Status: AC

## 2022-07-31 MED ORDER — CIPROFLOXACIN-DEXAMETHASONE 0.3-0.1 % OT SUSP: 3.0000 [drp] | Freq: Two times a day (BID) | OTIC | 0 refills | Status: DC

## 2022-07-31 NOTE — Progress Notes (Signed)
E Visit for Ear Pain - Swimmer's Ear  We are sorry that you are not feeling well. Here is how we plan to help!  Based on what you have shared with me it looks like you have Swimmer's Ear.  Swimmer's ear is a redness or swelling, irritation, or infection of your outer ear canal. These symptoms usually occur within a few days of swimming. Your ear canal is a tube that goes from the opening of the ear to the eardrum.  When water stays in your ear canal, germs can grow.  This is a painful condition that often happens to children and swimmers of all ages.  It is not contagious and oral antibiotics are not required to treat uncomplicated swimmer's ear.  The usual symptoms include:    Itchiness inside the ear  Redness or a sense of swelling in the ear  Pain when the ear is tugged on when pressure is placed on the ear  Pus draining from the infected ear    I have prescribed: Ciprofloxin 0.2% and hydrocortisone 1% otic suspension 3 drops in affected ears twice daily for 7 days  In certain cases, swimmer's ear may progress to a more serious bacterial infection of the middle or inner ear.  If you have a fever 102 and up and significantly worsening symptoms, this could indicate a more serious infection moving to the middle/inner and needs face to face evaluation in an office by a provider.  Your symptoms should improve over the next 3 days and should resolve in about 7 days.  Be sure to complete ALL of your prescription.  HOME CARE: Wash your hands frequently. If you are prescribed an ear drop, do not place the tip of the bottle on your ear or touch it with your fingers. You can take Acetaminophen 650 mg every 4-6 hours as needed for pain.  If pain is severe or moderate, you can apply a heating pad (set on low) or hot water bottle (wrapped in a towel) to outer ear for 20 minutes.  This will also increase drainage. Avoid ear plugs Do not go swimming until the symptoms are gone Do not use Q-tips After  showers, help the water run out by tilting your head to one side.   GET HELP RIGHT AWAY IF: Fever is over 102.2 degrees. You develop progressive ear pain or hearing loss. Ear symptoms persist longer than 3 days after treatment.  MAKE SURE YOU: Understand these instructions. Will watch your condition. Will get help right away if you are not doing well or get worse.  TO PREVENT SWIMMER'S EAR: Use a bathing cap or custom fitted swim molds to keep your ears dry. Towel off after swimming to dry your ears. Tilt your head or pull your earlobes to allow the water to escape your ear canal. If there is still water in your ears, consider using a hairdryer on the lowest setting.  Thank you for choosing an e-visit.  Your e-visit answers were reviewed by a board certified advanced clinical practitioner to complete your personal care plan. Depending upon the condition, your plan could have included both over the counter or prescription medications.  Please review your pharmacy choice. Make sure the pharmacy is open so you can pick up the prescription now. If there is a problem, you may contact your provider through MyChart messaging and have the prescription routed to another pharmacy.  Your safety is important to us. If you have drug allergies check your prescription carefully.     For the next 24 hours you can use MyChart to ask questions about today's visit, request a non-urgent call back, or ask for a work or school excuse. You will get an email with a survey after your eVisit asking about your experience. We would appreciate your feedback. I hope that your e-visit has been valuable and will aid in your recovery.    have provided 5 minutes of non face to face time during this encounter for chart review and documentation.   

## 2022-07-31 NOTE — Addendum Note (Signed)
Addended by: Mar Daring on: 07/31/2022 01:22 PM   Modules accepted: Orders

## 2022-08-04 MED ORDER — AMOXICILLIN 875 MG PO TABS: 875.0000 mg | ORAL_TABLET | Freq: Two times a day (BID) | ORAL | 0 refills | Status: AC

## 2022-08-04 NOTE — Addendum Note (Signed)
Addended by: Brunetta Jeans on: 08/04/2022 10:23 AM   Modules accepted: Orders

## 2022-08-11 LAB — CMP14+EGFR
ALT: 20 IU/L (ref 0–32)
AST: 15 IU/L (ref 0–40)
Albumin/Globulin Ratio: 1.5 (ref 1.2–2.2)
Albumin: 3.9 g/dL (ref 3.9–4.9)
Alkaline Phosphatase: 91 IU/L (ref 44–121)
BUN/Creatinine Ratio: 11 (ref 9–23)
BUN: 10 mg/dL (ref 6–24)
Bilirubin Total: 0.3 mg/dL (ref 0.0–1.2)
CO2: 23 mmol/L (ref 20–29)
Calcium: 9.6 mg/dL (ref 8.7–10.2)
Chloride: 105 mmol/L (ref 96–106)
Creatinine, Ser: 0.87 mg/dL (ref 0.57–1.00)
Globulin, Total: 2.6 g/dL (ref 1.5–4.5)
Glucose: 92 mg/dL (ref 70–99)
Potassium: 4.6 mmol/L (ref 3.5–5.2)
Sodium: 142 mmol/L (ref 134–144)
Total Protein: 6.5 g/dL (ref 6.0–8.5)
eGFR: 86 mL/min/{1.73_m2} (ref >59)

## 2022-08-11 LAB — LIPID PANEL
Chol/HDL Ratio: 3.3 ratio (ref 0.0–4.4)
Cholesterol, Total: 153 mg/dL (ref 100–199)
HDL: 47 mg/dL (ref >39)
LDL Chol Calc (NIH): 85 mg/dL (ref 0–99)
Triglycerides: 119 mg/dL (ref 0–149)
VLDL Cholesterol Cal: 21 mg/dL (ref 5–40)

## 2022-08-11 LAB — TSH: TSH: 1.67 u[IU]/mL (ref 0.450–4.500)

## 2022-08-11 LAB — HEMOGLOBIN A1C
Est. average glucose Bld gHb Est-mCnc: 114 mg/dL
Hgb A1c MFr Bld: 5.6 % (ref 4.8–5.6)

## 2022-08-11 LAB — CBC
Hematocrit: 37.1 % (ref 34.0–46.6)
Hemoglobin: 11.1 g/dL (ref 11.1–15.9)
MCH: 23.5 pg — ABNORMAL LOW (ref 26.6–33.0)
MCHC: 29.9 g/dL — ABNORMAL LOW (ref 31.5–35.7)
MCV: 78 fL — ABNORMAL LOW (ref 79–97)
Platelets: 354 10*3/uL (ref 150–450)
RBC: 4.73 x10E6/uL (ref 3.77–5.28)
RDW: 14.1 % (ref 11.7–15.4)
WBC: 7.7 10*3/uL (ref 3.4–10.8)

## 2022-08-11 LAB — VITAMIN D 25 HYDROXY (VIT D DEFICIENCY, FRACTURES): Vit D, 25-Hydroxy: 33.3 ng/mL (ref 30.0–100.0)

## 2022-08-11 LAB — IRON: Iron: 30 ug/dL (ref 27–159)

## 2022-08-11 LAB — FERRITIN: Ferritin: 8 ng/mL — ABNORMAL LOW (ref 15–150)

## 2022-08-17 ENCOUNTER — Ambulatory Visit (INDEPENDENT_AMBULATORY_CARE_PROVIDER_SITE_OTHER): Payer: Managed Care, Other (non HMO) | Admitting: Family Medicine

## 2022-08-17 ENCOUNTER — Encounter: Payer: Self-pay | Admitting: Family Medicine

## 2022-08-17 VITALS — BP 150/94 | HR 92 | Ht 65.0 in | Wt 240.0 lb

## 2022-08-17 DIAGNOSIS — K219 Gastro-esophageal reflux disease without esophagitis: Secondary | ICD-10-CM

## 2022-08-17 DIAGNOSIS — D509 Iron deficiency anemia, unspecified: Secondary | ICD-10-CM | POA: Diagnosis not present

## 2022-08-17 DIAGNOSIS — I10 Essential (primary) hypertension: Secondary | ICD-10-CM | POA: Diagnosis not present

## 2022-08-17 DIAGNOSIS — R0683 Snoring: Secondary | ICD-10-CM | POA: Diagnosis not present

## 2022-08-17 DIAGNOSIS — J3089 Other allergic rhinitis: Secondary | ICD-10-CM

## 2022-08-17 DIAGNOSIS — R5382 Chronic fatigue, unspecified: Secondary | ICD-10-CM | POA: Diagnosis not present

## 2022-08-17 DIAGNOSIS — G4730 Sleep apnea, unspecified: Secondary | ICD-10-CM

## 2022-08-17 MED ORDER — AMLODIPINE BESYLATE 5 MG PO TABS: 5.0000 mg | ORAL_TABLET | Freq: Every day | ORAL | 2 refills | Status: DC

## 2022-08-17 MED ORDER — AZELASTINE HCL 0.1 % NA SOLN: 2.0000 | Freq: Two times a day (BID) | NASAL | 12 refills | Status: AC

## 2022-08-17 MED ORDER — LORATADINE 10 MG PO TABS: 10.0000 mg | ORAL_TABLET | Freq: Every day | ORAL | 11 refills | Status: AC

## 2022-08-17 MED ORDER — OMEPRAZOLE 40 MG PO CPDR: 40.0000 mg | DELAYED_RELEASE_CAPSULE | Freq: Every day | ORAL | 2 refills | Status: DC

## 2022-08-17 NOTE — Patient Instructions (Addendum)
Follow-up in 4 to 6 weeks reevaluate blood pressure, call if you need me before.  Blood pressure is high you are started on amlodipine 5 mg daily.  You are referred to this pulmonary specialist to be evaluated for sleep apnea, this needs to be treated to protect your heart lungs and presents so I recommend following through with this.  You are referred to gastroenterologist to be evaluated for iron deficiency and reflux.  Please start over-the-counter iron tablets 325 mg once daily.I have also prescribed prilosec once daily  I do recommend that you make an appointment with your gynecologist to discuss contraceptive options.  For uncontrolled allergies please continue using Flonase daily and add Astelin nasal spray daily as well as loratadine tablets 1 twice daily.  Your allergies are uncontrolled which is contributing to headache and ear pressure.  If they remain uncontrolled on maximal medical therapy then you will need to be evaluated by an allergist to see if you need immunotherapy.  Continue to work at healthy lifestyle with regular exercise and plant-based eating, aim for 6 to 8 hours of sleep and aim to drink 64 ounces of water daily.

## 2022-08-19 ENCOUNTER — Ambulatory Visit: Payer: Managed Care, Other (non HMO) | Admitting: Family Medicine

## 2022-08-21 ENCOUNTER — Encounter: Payer: Self-pay | Admitting: Family Medicine

## 2022-08-21 DIAGNOSIS — I10 Essential (primary) hypertension: Secondary | ICD-10-CM | POA: Insufficient documentation

## 2022-08-21 DIAGNOSIS — D509 Iron deficiency anemia, unspecified: Secondary | ICD-10-CM | POA: Insufficient documentation

## 2022-08-21 NOTE — Assessment & Plan Note (Addendum)
Uncontrolled with solid dysphagia, GI eval, PPI prescribed x 1 month

## 2022-08-21 NOTE — Assessment & Plan Note (Signed)
Start amlodipine 5 mg daily and re eval in 4 to 6 weeks DASH diet and commitment to daily physical activity for a minimum of 30 minutes discussed and encouraged, as a part of hypertension management. The importance of attaining a healthy weight is also discussed.     08/17/2022    3:58 PM 08/17/2022    3:56 PM 08/17/2022    3:13 PM 08/17/2022    3:10 PM 02/16/2022   10:12 AM 05/13/2021   11:06 AM 05/13/2021   10:19 AM  BP/Weight  Systolic BP [id number] Q000111Q [id number]  Diastolic BP 94 90 75 [id number] 87 82 83  Wt. (Lbs)    240.04 225.8  246.08  BMI    39.94 kg/m2 37 kg/m2  40.95 kg/m2

## 2022-08-21 NOTE — Progress Notes (Signed)
Shawna Gonzales     MRN: [id number]      DOB: [birth year]   HPI Ms. Morang is here for follow up and re-evaluation of chronic medical conditions, medication management and review of any available recent lab and radiology data.  Preventive health is updated, specifically  Cancer screening and Immunization.   Recent UC visit.for migraine , elevated BP. C/o increased nasal congestion and sinus pressure resulting in headache and ear pain Had Spontaneous abortion in Fall 2023, ness to resume contraception as not committed to starting a family, no interest in OCP with new dx of HTN  C/o chronic fatigue, excess snoring had dx of OSA need re eval C/o excess weight gain wants and needs to reverse this  ROS Denies recent fever or chills. . Denies chest congestion, productive cough or wheezing. Denies chest pains, palpitations and leg swelling Denies abdominal pain, nausea, vomiting,diarrhea or constipation.   Denies dysuria, frequency, hesitancy or incontinence. Denies joint pain, swelling and limitation in mobility. Denies skin break down or rash.   PE  BP (!) 150/94   Pulse 92   Ht 5' 5"$  (1.651 m)   Wt 240 lb 0.6 oz (108.9 kg)   SpO2 93%   BMI 39.94 kg/m   Patient alert and oriented and in no cardiopulmonary distress.  HEENT: No facial asymmetry, EOMI,     Neck supple .Nasal and sinus congestion, TM mildly erythematous , good  light reflex  Chest: Clear to auscultation bilaterally.  CVS: S1, S2 no murmurs, no S3.Regular rate.  ABD: Soft non tender.   Ext: No edema  MS: Adequate ROM spine, shoulders, hips and knees.  Skin: Intact, no ulcerations or rash noted.  Psych: Good eye contact, normal affect. Memory intact not anxious or depressed appearing.  CNS: CN 2-12 intact, power,  normal throughout.no focal deficits noted.   Assessment & Plan  GERD (gastroesophageal reflux disease) Uncontrolled with solid dysphagia, GI eval, PPI prescribed x 1 month  Sleep  disorder breathing Snoring and fatigue refer for sleep study and re eval of sleep apnea  Morbid obesity ([place]) Deteriorated   Patient re-educated about  the importance of commitment to a  minimum of 150 minutes of exercise per week as able.  The importance of healthy food choices with portion control discussed, as well as eating regularly and within a 12 hour window most days. The need to choose "clean , green" food 50 to 75% of the time is discussed, as well as to make water the primary drink and set a goal of 64 ounces water daily.       08/17/2022    3:10 PM 02/16/2022   10:12 AM 05/13/2021   10:19 AM  Weight /BMI  Weight 240 lb 0.6 oz 225 lb 12.8 oz 246 lb 1.3 oz  Height 5' 5"$  (1.651 m) 5' 5.5" (1.664 m) 5' 5"$  (1.651 m)  BMI 39.94 kg/m2 37 kg/m2 40.95 kg/m2      IDA (iron deficiency anemia) Reports light cycles, refer to I for eval, start OTC iron 325 mg daily  Allergic rhinitis Uncontrolled add daily astelin, continue flonase and double dose of claritin If uncontrolled then will need eval by Allergist  Benign essential HTN Start amlodipine 5 mg daily and re eval in 4 to 6 weeks DASH diet and commitment to daily physical activity for a minimum of 30 minutes discussed and encouraged, as a part of hypertension management. The importance of attaining a healthy weight is also discussed.  08/17/2022    3:58 PM 08/17/2022    3:56 PM 08/17/2022    3:13 PM 08/17/2022    3:10 PM 02/16/2022   10:12 AM 05/13/2021   11:06 AM 05/13/2021   10:19 AM  BP/Weight  Systolic BP Q000111Q Q000111Q [id number]  Diastolic BP 94 90 75 [id number] 87 82 83  Wt. (Lbs)    240.04 225.8  246.08  BMI    39.94 kg/m2 37 kg/m2  40.95 kg/m2

## 2022-08-21 NOTE — Assessment & Plan Note (Signed)
Snoring and fatigue refer for sleep study and re eval of sleep apnea

## 2022-08-21 NOTE — Assessment & Plan Note (Signed)
Deteriorated   Patient re-educated about  the importance of commitment to a  minimum of 150 minutes of exercise per week as able.  The importance of healthy food choices with portion control discussed, as well as eating regularly and within a 12 hour window most days. The need to choose "clean , green" food 50 to 75% of the time is discussed, as well as to make water the primary drink and set a goal of 64 ounces water daily.       08/17/2022    3:10 PM 02/16/2022   10:12 AM 05/13/2021   10:19 AM  Weight /BMI  Weight 240 lb 0.6 oz 225 lb 12.8 oz 246 lb 1.3 oz  Height 5' 5"$  (1.651 m) 5' 5.5" (1.664 m) 5' 5"$  (1.651 m)  BMI 39.94 kg/m2 37 kg/m2 40.95 kg/m2

## 2022-08-21 NOTE — Assessment & Plan Note (Signed)
Uncontrolled add daily astelin, continue flonase and double dose of claritin If uncontrolled then will need eval by Allergist

## 2022-08-21 NOTE — Assessment & Plan Note (Addendum)
Reports light cycles, refer to I for eval, start OTC iron 325 mg daily

## 2022-08-22 ENCOUNTER — Encounter: Payer: Self-pay | Admitting: Family Medicine

## 2022-09-28 ENCOUNTER — Encounter: Payer: Self-pay | Admitting: Family Medicine

## 2022-09-28 NOTE — Telephone Encounter (Signed)
Called patient left voicemail to call office to schedule an office visit.

## 2022-10-05 ENCOUNTER — Encounter: Payer: Self-pay | Admitting: Family Medicine

## 2022-10-05 ENCOUNTER — Ambulatory Visit: Payer: Managed Care, Other (non HMO) | Admitting: Family Medicine

## 2022-10-05 VITALS — BP 128/77 | HR 91 | Ht 65.0 in | Wt 242.1 lb

## 2022-10-05 DIAGNOSIS — R5382 Chronic fatigue, unspecified: Secondary | ICD-10-CM | POA: Diagnosis not present

## 2022-10-05 DIAGNOSIS — M62838 Other muscle spasm: Secondary | ICD-10-CM | POA: Diagnosis not present

## 2022-10-05 DIAGNOSIS — I10 Essential (primary) hypertension: Secondary | ICD-10-CM

## 2022-10-05 DIAGNOSIS — Z23 Encounter for immunization: Secondary | ICD-10-CM | POA: Diagnosis not present

## 2022-10-05 MED ORDER — IBUPROFEN 800 MG PO TABS: ORAL_TABLET | ORAL | 0 refills | Status: DC

## 2022-10-05 MED ORDER — CYCLOBENZAPRINE HCL 10 MG PO TABS: ORAL_TABLET | ORAL | 0 refills | Status: DC

## 2022-10-05 MED ORDER — CLINDAMYCIN PHOSPHATE 1 % EX LOTN: TOPICAL_LOTION | Freq: Two times a day (BID) | CUTANEOUS | 5 refills | Status: AC

## 2022-10-05 MED ORDER — PHENTERMINE HCL 15 MG PO CAPS: 15.0000 mg | ORAL_CAPSULE | ORAL | 2 refills | Status: DC

## 2022-10-05 NOTE — Patient Instructions (Addendum)
F/U in 10 weeks to 11 weeks, re evaluate weight , call if you need me sooner  TdAP today  Ferritn, iron , B12 and folate and CBC 3 to 5 days before f/u  Short course of ibuprofen and flexeril is prescribed for left neck spasm  Weight loss goal of 10 to 13 pounds in next 10 weeks  New for weight loss is phentermine one daily  It is important that you exercise regularly at least 30 minutes 5 times a week. If you develop chest pain, have severe difficulty breathing, or feel very tired, stop exercising immediately and seek medical attention   Think about what you will eat, plan ahead. Choose " clean, green, fresh or frozen" over canned, processed or packaged foods which are more sugary, salty and fatty. 70 to 75% of food eaten should be vegetables and fruit. Three meals at set times with snacks allowed between meals, but they must be fruit or vegetables. Aim to eat over a 12 hour period , example 7 am to 7 pm, and STOP after  your last meal of the day. Drink water,generally about 64 ounces per day, no other drink is as healthy. Fruit juice is best enjoyed in a healthy way, by EATING the fruit.  Thanks for choosing [facility], we consider it a privelige to serve you.

## 2022-10-11 ENCOUNTER — Encounter: Payer: Self-pay | Admitting: Family Medicine

## 2022-10-11 DIAGNOSIS — M62838 Other muscle spasm: Secondary | ICD-10-CM | POA: Insufficient documentation

## 2022-10-11 DIAGNOSIS — R5382 Chronic fatigue, unspecified: Secondary | ICD-10-CM | POA: Insufficient documentation

## 2022-10-11 NOTE — Progress Notes (Signed)
BANI UM     MRN: [id number]      DOB: [birth year]   HPI Ms. Service is here for follow up and re-evaluation of chronic medical conditions, medication management and review of any available recent lab and radiology data.  Preventive health is updated, specifically  Cancer screening and Immunization.   Questions or concerns regarding consultations or procedures which the PT has had in the interim are  addressed. The PT denies any adverse reactions to current medications since the last visit.  C/o left neck pain and spasm, no inciting trauma, x 1 week Wants help with weight loss  C/o fatigue and anemia needs labs updated  ROS Denies recent fever or chills. Denies sinus pressure, nasal congestion, ear pain or sore throat. Denies chest congestion, productive cough or wheezing. Denies chest pains, palpitations and leg swelling Denies abdominal pain, nausea, vomiting,diarrhea or constipation.   Denies dysuria, frequency, hesitancy or incontinence.  Denies headaches, seizures, numbness, or tingling. Denies depression, anxiety or insomnia. Denies skin break down or rash.   PE  BP 128/77 (BP Location: Right Arm, Patient Position: Sitting, Cuff Size: Large)   Pulse 91   Ht 5\' 5"  (1.651 m)   Wt 242 lb 1.9 oz (109.8 kg)   SpO2 95%   BMI 40.29 kg/m   Patient alert and oriented and in no cardiopulmonary distress.  HEENT: No facial asymmetry, EOMI,     Neck supple .  Chest: Clear to auscultation bilaterally.  CVS: S1, S2 no murmurs, no S3.Regular rate.  ABD: Soft non tender.   Ext: No edema  MS: Adequate ROM spine, shoulders, hips and knees.  Skin: Intact, no ulcerations or rash noted.  Psych: Good eye contact, normal affect. Memory intact not anxious or depressed appearing.  CNS: CN 2-12 intact, power,  normal throughout.no focal deficits noted.   Assessment & Plan  Benign essential HTN Controlled, no change in medication DASH diet and commitment to daily  physical activity for a minimum of 30 minutes discussed and encouraged, as a part of hypertension management. The importance of attaining a healthy weight is also discussed.     10/05/2022    2:04 PM 08/17/2022    3:58 PM 08/17/2022    3:56 PM 08/17/2022    3:13 PM 08/17/2022    3:10 PM 02/16/2022   10:12 AM 05/13/2021   11:06 AM  BP/Weight  Systolic BP 128 150 150 143 180 137 124  Diastolic BP 77 94 90 75 104 87 82  Wt. (Lbs) 242.12    240.04 225.8   BMI 40.29 kg/m2    39.94 kg/m2 37 kg/m2        Morbid obesity ([facility]CC)  Patient re-educated about  the importance of commitment to a  minimum of 150 minutes of exercise per week as able.  The importance of healthy food choices with portion control discussed, as well as eating regularly and within a 12 hour window most days. The need to choose "clean , green" food 50 to 75% of the time is discussed, as well as to make water the primary drink and set a goal of 64 ounces water daily.       10/05/2022    2:04 PM 08/17/2022    3:10 PM 02/16/2022   10:12 AM  Weight /BMI  Weight 242 lb 1.9 oz 240 lb 0.6 oz 225 lb 12.8 oz  Height 5\' 5"  (1.651 m) 5\' 5"  (1.651 m) 5' 5.5" (1.664 m)  BMI 40.29 kg/m2 39.94  kg/m2 37 kg/m2    Start low dose phentermine , reevalin 8 weeks  Neck muscle spasm Anti inflammatory and short course muscle relaxant  Chronic fatigue Check anemia panel

## 2022-10-11 NOTE — Assessment & Plan Note (Signed)
  Patient re-educated about  the importance of commitment to a  minimum of 150 minutes of exercise per week as able.  The importance of healthy food choices with portion control discussed, as well as eating regularly and within a 12 hour window most days. The need to choose "clean , green" food 50 to 75% of the time is discussed, as well as to make water the primary drink and set a goal of 64 ounces water daily.       10/05/2022    2:04 PM 08/17/2022    3:10 PM 02/16/2022   10:12 AM  Weight /BMI  Weight 242 lb 1.9 oz 240 lb 0.6 oz 225 lb 12.8 oz  Height 5\' 5"  (1.651 m) 5\' 5"  (1.651 m) 5' 5.5" (1.664 m)  BMI 40.29 kg/m2 39.94 kg/m2 37 kg/m2    Start low dose phentermine , reevalin 8 weeks

## 2022-10-11 NOTE — Assessment & Plan Note (Signed)
Anti inflammatory and short course muscle relaxant

## 2022-10-11 NOTE — Assessment & Plan Note (Signed)
Controlled, no change in medication DASH diet and commitment to daily physical activity for a minimum of 30 minutes discussed and encouraged, as a part of hypertension management. The importance of attaining a healthy weight is also discussed.     10/05/2022    2:04 PM 08/17/2022    3:58 PM 08/17/2022    3:56 PM 08/17/2022    3:13 PM 08/17/2022    3:10 PM 02/16/2022   10:12 AM 05/13/2021   11:06 AM  BP/Weight  Systolic BP 128 150 150 143 180 137 124  Diastolic BP 77 94 90 75 104 87 82  Wt. (Lbs) 242.12    240.04 225.8   BMI 40.29 kg/m2    39.94 kg/m2 37 kg/m2

## 2022-10-11 NOTE — Assessment & Plan Note (Signed)
Check anemia panel °

## 2022-10-17 ENCOUNTER — Encounter: Payer: Self-pay | Admitting: Family Medicine

## 2022-10-17 DIAGNOSIS — M542 Cervicalgia: Secondary | ICD-10-CM

## 2022-10-20 NOTE — Telephone Encounter (Signed)
Order faxed.

## 2022-10-25 ENCOUNTER — Encounter: Payer: Self-pay | Admitting: Family Medicine

## 2022-10-25 DIAGNOSIS — M542 Cervicalgia: Secondary | ICD-10-CM

## 2022-11-12 ENCOUNTER — Other Ambulatory Visit: Payer: Self-pay | Admitting: Family Medicine

## 2022-11-30 ENCOUNTER — Encounter: Payer: Self-pay | Admitting: Family Medicine

## 2022-12-12 ENCOUNTER — Other Ambulatory Visit: Payer: Self-pay | Admitting: Family Medicine

## 2022-12-13 LAB — IRON: Iron: 30 ug/dL (ref 27–159)

## 2022-12-13 LAB — CBC
Hematocrit: 36.5 % (ref 34.0–46.6)
Hemoglobin: 11.8 g/dL (ref 11.1–15.9)
MCH: 25.5 pg — ABNORMAL LOW (ref 26.6–33.0)
MCHC: 32.3 g/dL (ref 31.5–35.7)
MCV: 79 fL (ref 79–97)
Platelets: 331 10*3/uL (ref 150–450)
RBC: 4.62 x10E6/uL (ref 3.77–5.28)
RDW: 14.7 % (ref 11.7–15.4)
WBC: 7.1 10*3/uL (ref 3.4–10.8)

## 2022-12-13 LAB — FERRITIN: Ferritin: 11 ng/mL — ABNORMAL LOW (ref 15–150)

## 2022-12-13 LAB — VITAMIN B12: Vitamin B-12: 564 pg/mL (ref 232–1245)

## 2022-12-13 LAB — FOLATE: Folate: 8.5 ng/mL (ref >3.0)

## 2022-12-15 ENCOUNTER — Ambulatory Visit: Payer: Managed Care, Other (non HMO) | Admitting: Family Medicine

## 2022-12-16 ENCOUNTER — Encounter: Payer: Self-pay | Admitting: Family Medicine

## 2022-12-19 ENCOUNTER — Other Ambulatory Visit: Payer: Self-pay | Admitting: Family Medicine

## 2022-12-19 MED ORDER — SUMATRIPTAN SUCCINATE 100 MG PO TABS: 100.0000 mg | ORAL_TABLET | ORAL | 2 refills | Status: DC | PRN

## 2022-12-23 ENCOUNTER — Ambulatory Visit: Payer: Managed Care, Other (non HMO) | Admitting: Family Medicine

## 2022-12-23 ENCOUNTER — Encounter: Payer: Self-pay | Admitting: Family Medicine

## 2022-12-23 DIAGNOSIS — I10 Essential (primary) hypertension: Secondary | ICD-10-CM

## 2022-12-23 DIAGNOSIS — J3089 Other allergic rhinitis: Secondary | ICD-10-CM

## 2022-12-23 DIAGNOSIS — D500 Iron deficiency anemia secondary to blood loss (chronic): Secondary | ICD-10-CM | POA: Diagnosis not present

## 2022-12-23 DIAGNOSIS — M62838 Other muscle spasm: Secondary | ICD-10-CM

## 2022-12-23 DIAGNOSIS — R790 Abnormal level of blood mineral: Secondary | ICD-10-CM | POA: Diagnosis not present

## 2022-12-23 DIAGNOSIS — G43009 Migraine without aura, not intractable, without status migrainosus: Secondary | ICD-10-CM

## 2022-12-23 DIAGNOSIS — G4730 Sleep apnea, unspecified: Secondary | ICD-10-CM

## 2022-12-23 MED ORDER — AMLODIPINE BESYLATE 5 MG PO TABS: ORAL_TABLET | ORAL | 2 refills | Status: DC

## 2022-12-23 MED ORDER — SUMATRIPTAN SUCCINATE 100 MG PO TABS: 100.0000 mg | ORAL_TABLET | ORAL | 2 refills | Status: DC | PRN

## 2022-12-23 MED ORDER — PHENTERMINE HCL 15 MG PO CAPS
15.0000 mg | ORAL_CAPSULE | ORAL | 2 refills | Status: DC
Start: 2022-12-23 — End: 2023-05-05

## 2022-12-23 NOTE — Patient Instructions (Addendum)
F/U in 11 weeks call if you need me sooner  Excellent blood pressure, encouraging weight loss, keep it up  We will provide you with contact info for pulmonary office you have been referred to    You are referred to hematology with [facility] regarding management of low iron/ ferritin, please get in touch withj this office if you have del;ay/ difficulty getting the appointment    It is important that you exercise regularly at least 30 minutes 5 times a week. If you develop chest pain, have severe difficulty breathing, or feel very tired, stop exercising immediately and seek medical attention   Think about what you will eat, plan ahead. Choose " clean, green, fresh or frozen" over canned, processed or packaged foods which are more sugary, salty and fatty. 70 to 75% of food eaten should be vegetables and fruit. Three meals at set times with snacks allowed between meals, but they must be fruit or vegetables. Aim to eat over a 12 hour period , example 7 am to 7 pm, and STOP after  your last meal of the day. Drink water,generally about 64 ounces per day, no other drink is as healthy. Fruit juice is best enjoyed in a healthy way, by EATING the fruit.  Thanks for choosing [facility], we consider it a privelige to serve you.

## 2022-12-24 ENCOUNTER — Encounter: Payer: Self-pay | Admitting: Family Medicine

## 2022-12-24 DIAGNOSIS — R790 Abnormal level of blood mineral: Secondary | ICD-10-CM | POA: Insufficient documentation

## 2022-12-24 NOTE — Assessment & Plan Note (Signed)
Controlled, no change in medication  

## 2022-12-24 NOTE — Assessment & Plan Note (Signed)
Still awaiting appt for eval by pulmonary, she will try to make direct contact and reach out for help if still a problem

## 2022-12-24 NOTE — Assessment & Plan Note (Signed)
Resolved, never got appt with Ortho she was referred to but since resolved , no interest in pursuing referrl

## 2022-12-24 NOTE — Assessment & Plan Note (Signed)
Refer to hematology, has heavy menses, no interest in pursuing recommendations of Gyne at this time No response to oral iron

## 2022-12-24 NOTE — Assessment & Plan Note (Signed)
  Patient re-educated about  the importance of commitment to a  minimum of 150 minutes of exercise per week as able.  The importance of healthy food choices with portion control discussed, as well as eating regularly and within a 12 hour window most days. The need to choose "clean , green" food 50 to 75% of the time is discussed, as well as to make water the primary drink and set a goal of 64 ounces water daily.       12/23/2022    1:30 PM 10/05/2022    2:04 PM 08/17/2022    3:10 PM  Weight /BMI  Weight 236 lb 242 lb 1.9 oz 240 lb 0.6 oz  Height 5\' 5"  (1.651 m) 5\' 5"  (1.651 m) 5\' 5"  (1.651 m)  BMI 39.27 kg/m2 40.29 kg/m2 39.94 kg/m2    Improved, which is great

## 2022-12-24 NOTE — Assessment & Plan Note (Signed)
Controlled, no change in medication DASH diet and commitment to daily physical activity for a minimum of 30 minutes discussed and encouraged, as a part of hypertension management. The importance of attaining a healthy weight is also discussed.     12/23/2022    1:52 PM 12/23/2022    1:30 PM 10/05/2022    2:04 PM 08/17/2022    3:58 PM 08/17/2022    3:56 PM 08/17/2022    3:13 PM 08/17/2022    3:10 PM  BP/Weight  Systolic BP 128 137 128 150 150 143 180  Diastolic BP 74 82 77 94 90 75 098  Wt. (Lbs)  236 242.12    240.04  BMI  39.27 kg/m2 40.29 kg/m2    39.94 kg/m2

## 2022-12-24 NOTE — Progress Notes (Signed)
Shawna Gonzales     MRN: [id number]      DOB: [birth year]  Chief Complaint  Patient presents with   Hypertension    Follow up visit. Never heard back about the sleep study    Referral    Still has not heard from pulmonary referral to [facility] back in feb    HPI Shawna Gonzales is here for follow up and re-evaluation of chronic medical conditions, medication management and review of any available recent lab and radiology data.  Preventive health is updated, specifically  Cancer screening and Immunization.   Still needs appt for eval for sleep apnea New referral needed as no response  to oral iron. The PT denies any adverse reactions to current medications since the last visit.  Has been working on food choice and has committed to exercise on avg 4 days per week, is pleased with weight loss result to date   ROS Denies recent fever or chills. Denies sinus pressure, nasal congestion, ear pain or sore throat. Denies chest congestion, productive cough or wheezing. Denies chest pains, palpitations and leg swelling Denies abdominal pain, nausea, vomiting,diarrhea or constipation.   Denies dysuria, frequency, hesitancy or incontinence. Denies joint pain, swelling and limitation in mobility. Denies headaches, seizures, numbness, or tingling. Denies depression, anxiety or insomnia. Denies skin break down or rash.   PE  BP 128/74   Pulse 97   Resp 16   Ht 5\' 5"  (1.651 m)   Wt 236 lb (107 kg)   SpO2 97%   BMI 39.27 kg/m   Patient alert and oriented and in no cardiopulmonary distress.  HEENT: No facial asymmetry, EOMI,     Neck supple .  Chest: Clear to auscultation bilaterally.  CVS: S1, S2 no murmurs, no S3.Regular rate.  ABD: Soft non tender.   Ext: No edema  MS: Adequate ROM spine, shoulders, hips and knees.  Skin: Intact, no ulcerations or rash noted.  Psych: Good eye contact, normal affect. Memory intact not anxious or depressed appearing.  CNS: CN 2-12 intact,  power,  normal throughout.no focal deficits noted.   Assessment & Plan  Benign essential HTN Controlled, no change in medication DASH diet and commitment to daily physical activity for a minimum of 30 minutes discussed and encouraged, as a part of hypertension management. The importance of attaining a healthy weight is also discussed.     12/23/2022    1:52 PM 12/23/2022    1:30 PM 10/05/2022    2:04 PM 08/17/2022    3:58 PM 08/17/2022    3:56 PM 08/17/2022    3:13 PM 08/17/2022    3:10 PM  BP/Weight  Systolic BP 128 137 128 150 150 143 180  Diastolic BP 74 82 77 94 90 75 191  Wt. (Lbs)  236 242.12    240.04  BMI  39.27 kg/m2 40.29 kg/m2    39.94 kg/m2       IDA (iron deficiency anemia) Refer to hematology, has heavy menses, no interest in pursuing recommendations of Gyne at this time No response to oral iron  Low ferritin Low ferritin despite supplementation with oral iron, refer hematology   Morbid obesity [facility])  Patient re-educated about  the importance of commitment to a  minimum of 150 minutes of exercise per week as able.  The importance of healthy food choices with portion control discussed, as well as eating regularly and within a 12 hour window most days. The need to choose "clean , green" food 50  to 75% of the time is discussed, as well as to make water the primary drink and set a goal of 64 ounces water daily.       12/23/2022    1:30 PM 10/05/2022    2:04 PM 08/17/2022    3:10 PM  Weight /BMI  Weight 236 lb 242 lb 1.9 oz 240 lb 0.6 oz  Height 5\' 5"  (1.651 m) 5\' 5"  (1.651 m) 5\' 5"  (1.651 m)  BMI 39.27 kg/m2 40.29 kg/m2 39.94 kg/m2    Improved, which is great  Neck muscle spasm Resolved, never got appt with Ortho she was referred to but since resolved , no interest in pursuing referrl  Migraine Controlled, no change in medication   Allergic rhinitis Controlled, no change in medication   Sleep disorder breathing Still awaiting appt for eval by  pulmonary, she will try to make direct contact and reach out for help if still a problem

## 2022-12-24 NOTE — Assessment & Plan Note (Signed)
Low ferritin despite supplementation with oral iron, refer hematology

## 2023-02-15 ENCOUNTER — Encounter: Payer: Self-pay | Admitting: Family Medicine

## 2023-02-16 ENCOUNTER — Other Ambulatory Visit: Payer: Self-pay

## 2023-02-16 DIAGNOSIS — Z111 Encounter for screening for respiratory tuberculosis: Secondary | ICD-10-CM

## 2023-02-27 LAB — HM MAMMOGRAPHY

## 2023-03-08 ENCOUNTER — Encounter: Payer: Self-pay | Admitting: Family Medicine

## 2023-03-09 ENCOUNTER — Ambulatory Visit: Payer: Managed Care, Other (non HMO) | Admitting: Family Medicine

## 2023-04-10 ENCOUNTER — Encounter: Payer: Self-pay | Admitting: Family Medicine

## 2023-05-05 ENCOUNTER — Encounter: Payer: Self-pay | Admitting: Family Medicine

## 2023-05-05 ENCOUNTER — Ambulatory Visit (INDEPENDENT_AMBULATORY_CARE_PROVIDER_SITE_OTHER): Payer: Managed Care, Other (non HMO) | Admitting: Family Medicine

## 2023-05-05 VITALS — BP 132/84 | HR 94 | Ht 65.0 in | Wt 239.0 lb

## 2023-05-05 DIAGNOSIS — Z0000 Encounter for general adult medical examination without abnormal findings: Secondary | ICD-10-CM | POA: Diagnosis not present

## 2023-05-05 DIAGNOSIS — R300 Dysuria: Secondary | ICD-10-CM

## 2023-05-05 MED ORDER — TIRZEPATIDE 5 MG/0.5ML ~~LOC~~ SOAJ: 5.0000 mg | SUBCUTANEOUS | 1 refills | Status: DC

## 2023-05-05 MED ORDER — TIRZEPATIDE 2.5 MG/0.5ML ~~LOC~~ SOAJ: 2.5000 mg | SUBCUTANEOUS | 0 refills | Status: DC

## 2023-05-05 NOTE — Assessment & Plan Note (Signed)
Annual exam as documented. Counseling done  re healthy lifestyle involving commitment to 150 minutes exercise per week, heart healthy diet, and attaining healthy weight.The importance of adequate sleep also discussed. Changes in health habits are decided on by the patient with goals and time frames  set for achieving them. Immunization and cancer screening needs are specifically addressed at this visit. 

## 2023-05-05 NOTE — Patient Instructions (Signed)
Follow-up in 10 weeks, call if you need me sooner.  Please do reach out for nutrition counseling.  Caloric goal range is 1200 to 1500 cal/day.  Choose vegetable fruit and white meats 95% of the time.  Please commit to 64 ounces of water daily.  Please commit to 30 to 45 minutes of exercise 5 to 6 days/week.  New to help with weight loss is Zepbound the initial month is 2.5 mg once weekly and the additional 2 months prescriptions are for 5 mg weekly.  Weight loss goal  Is 1 to 2  pound per week.  Eat  on a regular schedule and try  to have a set bedtime where you aim for 8 hours of sleep.  I expect excellent results and congratulate you on your decision to work on your health  ENJOY ZUMBA!!  Best for the season and 2025!  Thanks for choosing [facility], we consider it a privelige to serve you.

## 2023-05-07 DIAGNOSIS — R300 Dysuria: Secondary | ICD-10-CM | POA: Insufficient documentation

## 2023-05-07 MED ORDER — ZEPBOUND 5 MG/0.5ML ~~LOC~~ SOAJ: 5.0000 mg | SUBCUTANEOUS | 1 refills | Status: DC

## 2023-05-07 MED ORDER — ZEPBOUND 2.5 MG/0.5ML ~~LOC~~ SOAJ: 2.5000 mg | SUBCUTANEOUS | 0 refills | Status: DC

## 2023-05-07 NOTE — Assessment & Plan Note (Signed)
CCIUA tested and is nomal, no infection, encouraged to increase water intake

## 2023-05-07 NOTE — Assessment & Plan Note (Signed)
  Patient re-educated about  the importance of commitment to a  minimum of 150 minutes of exercise per week as able.  The importance of healthy food choices with portion control discussed, as well as eating regularly and within a 12 hour window most days. The need to choose "clean , green" food 50 to 75% of the time is discussed, as well as to make water the primary drink and set a goal of 64 ounces water daily.       05/05/2023   10:13 AM 12/23/2022    1:30 PM 10/05/2022    2:04 PM  Weight /BMI  Weight 239 lb 236 lb 242 lb 1.9 oz  Height 5\' 5"  (1.651 m) 5\' 5"  (1.651 m) 5\' 5"  (1.651 m)  BMI 39.77 kg/m2 39.27 kg/m2 40.29 kg/m2    Start weekly zepbound,  re eval in 10 weeks

## 2023-05-07 NOTE — Progress Notes (Addendum)
    Shawna Gonzales     MRN: [id number]      DOB: [birth year]  Chief Complaint  Patient presents with   Annual Exam    CPE urine check    HPI: Patient is in for annual physical exam. Sates urine is cloudy and wants it checked for infection Wants medication to help with weightl loss , injectable, did not tolerate phentermine   Immunization is reviewed , and  is up to date  PE: BP 132/84 (BP Location: Right Arm, Patient Position: Sitting, Cuff Size: Large)   Pulse 94   Ht 5\' 5"  (1.651 m)   Wt 239 lb (108.4 kg)   SpO2 98%   BMI 39.77 kg/m   Pleasant  female, alert and oriented x 3, in no cardio-pulmonary distress. Afebrile. HEENT No facial trauma or asymetry. Sinuses non tender.  Extra occullar muscles intact.. External ears normal, . Neck: supple, no adenopathy,JVD or thyromegaly.No bruits.  Chest: Clear to ascultation bilaterally.No crackles or wheezes. Non tender to palpation    Cardiovascular system; Heart sounds normal,  S1 and  S2 ,no S3.  No murmur, or thrill. Apical beat not displaced Peripheral pulses normal.  Abdomen: Soft, non tender, no organomegaly or masses. No bruits. Bowel sounds normal. No guarding, tenderness or rebound.     Musculoskeletal exam: Full ROM of spine, hips , shoulders and knees. No deformity ,swelling or crepitus noted. No muscle wasting or atrophy.   Neurologic: Cranial nerves 2 to 12 intact. Power, tone ,sensation and reflexes normal throughout. No disturbance in gait. No tremor.  Skin: Intact, no ulceration, erythema , scaling or rash noted. Pigmentation normal throughout  Psych; Normal mood and affect. Judgement and concentration normal   Assessment & Plan:  Annual physical exam Annual exam as documented. Counseling done  re healthy lifestyle involving commitment to 150 minutes exercise per week, heart healthy diet, and attaining healthy weight.The importance of adequate sleep also discussed.  Changes in  health habits are decided on by the patient with goals and time frames  set for achieving them. Immunization and cancer screening needs are specifically addressed at this visit.   Dysuria CCIUA tested and is nomal, no infection, encouraged to increase water intake  Morbid obesity (HCC)  Patient re-educated about  the importance of commitment to a  minimum of 150 minutes of exercise per week as able.  The importance of healthy food choices with portion control discussed, as well as eating regularly and within a 12 hour window most days. The need to choose "clean , green" food 50 to 75% of the time is discussed, as well as to make water the primary drink and set a goal of 64 ounces water daily.       05/05/2023   10:13 AM 12/23/2022    1:30 PM 10/05/2022    2:04 PM  Weight /BMI  Weight 239 lb 236 lb 242 lb 1.9 oz  Height 5\' 5"  (1.651 m) 5\' 5"  (1.651 m) 5\' 5"  (1.651 m)  BMI 39.77 kg/m2 39.27 kg/m2 40.29 kg/m2    Start weekly zepbound,  re eval in 10 weeks

## 2023-05-08 LAB — URINALYSIS
Bilirubin, UA: NEGATIVE
Glucose, UA: NEGATIVE
Ketones, UA: NEGATIVE
Leukocytes,UA: NEGATIVE
Nitrite, UA: NEGATIVE
Protein,UA: NEGATIVE
RBC, UA: NEGATIVE
Specific Gravity, UA: 1.025 (ref 1.005–1.030)
Urobilinogen, Ur: 0.2 mg/dL (ref 0.2–1.0)
pH, UA: 7.5 (ref 5.0–7.5)

## 2023-05-12 ENCOUNTER — Encounter: Payer: Self-pay | Admitting: Family Medicine

## 2023-07-19 ENCOUNTER — Encounter: Payer: Self-pay | Admitting: Family Medicine

## 2023-07-20 ENCOUNTER — Other Ambulatory Visit: Payer: Self-pay | Admitting: Family Medicine

## 2023-07-20 MED ORDER — PANTOPRAZOLE SODIUM 40 MG PO TBEC: DELAYED_RELEASE_TABLET | ORAL | 0 refills | Status: DC

## 2023-07-26 ENCOUNTER — Ambulatory Visit: Payer: Managed Care, Other (non HMO) | Admitting: Family Medicine

## 2023-08-25 ENCOUNTER — Other Ambulatory Visit: Payer: Self-pay | Admitting: Family Medicine

## 2023-11-01 ENCOUNTER — Other Ambulatory Visit: Payer: Self-pay | Admitting: Family Medicine

## 2023-11-01 MED ORDER — PANTOPRAZOLE SODIUM 40 MG PO TBEC: DELAYED_RELEASE_TABLET | ORAL | 1 refills | Status: DC

## 2023-12-11 ENCOUNTER — Other Ambulatory Visit: Payer: Self-pay | Admitting: Family Medicine

## 2023-12-13 ENCOUNTER — Ambulatory Visit: Payer: Managed Care, Other (non HMO) | Admitting: Family Medicine

## 2023-12-14 ENCOUNTER — Ambulatory Visit: Payer: Managed Care, Other (non HMO) | Admitting: Family Medicine

## 2023-12-15 ENCOUNTER — Encounter: Payer: Self-pay | Admitting: Family Medicine

## 2023-12-15 ENCOUNTER — Ambulatory Visit: Admitting: Family Medicine

## 2023-12-15 VITALS — BP 136/82 | HR 77 | Resp 16 | Ht 65.0 in | Wt 240.0 lb

## 2023-12-15 DIAGNOSIS — R7302 Impaired glucose tolerance (oral): Secondary | ICD-10-CM

## 2023-12-15 DIAGNOSIS — G43009 Migraine without aura, not intractable, without status migrainosus: Secondary | ICD-10-CM

## 2023-12-15 DIAGNOSIS — I10 Essential (primary) hypertension: Secondary | ICD-10-CM

## 2023-12-15 DIAGNOSIS — K5730 Diverticulosis of large intestine without perforation or abscess without bleeding: Secondary | ICD-10-CM | POA: Diagnosis not present

## 2023-12-15 DIAGNOSIS — E559 Vitamin D deficiency, unspecified: Secondary | ICD-10-CM

## 2023-12-15 DIAGNOSIS — K219 Gastro-esophageal reflux disease without esophagitis: Secondary | ICD-10-CM

## 2023-12-15 DIAGNOSIS — E785 Hyperlipidemia, unspecified: Secondary | ICD-10-CM

## 2023-12-15 DIAGNOSIS — G4733 Obstructive sleep apnea (adult) (pediatric): Secondary | ICD-10-CM

## 2023-12-15 DIAGNOSIS — J3089 Other allergic rhinitis: Secondary | ICD-10-CM

## 2023-12-15 DIAGNOSIS — D219 Benign neoplasm of connective and other soft tissue, unspecified: Secondary | ICD-10-CM | POA: Insufficient documentation

## 2023-12-15 DIAGNOSIS — R5382 Chronic fatigue, unspecified: Secondary | ICD-10-CM

## 2023-12-15 NOTE — Assessment & Plan Note (Signed)
 Will starrt cPAP

## 2023-12-15 NOTE — Assessment & Plan Note (Signed)
  Patient re-educated about  the importance of commitment to a  minimum of 150 minutes of exercise per week as able.  The importance of healthy food choices with portion control discussed, as well as eating regularly and within a 12 hour window most days. The need to choose clean , green food 50 to 75% of the time is discussed, as well as to make water the primary drink and set a goal of 64 ounces water daily.       12/15/2023    9:37 AM 05/05/2023   10:13 AM 12/23/2022    1:30 PM  Weight /BMI  Weight 240 lb 239 lb 236 lb  Height 5' 5 (1.651 m) 5' 5 (1.651 m) 5' 5 (1.651 m)  BMI 39.94 kg/m2 39.77 kg/m2 39.27 kg/m2

## 2023-12-15 NOTE — Assessment & Plan Note (Signed)
 Controlled, no change in medication

## 2023-12-15 NOTE — Assessment & Plan Note (Signed)
 Currently uncontrolled, imitrex  not well tolerated , upcoming appt with Neurology

## 2023-12-15 NOTE — Assessment & Plan Note (Signed)
 Hyperlipidemia:Low fat diet discussed and encouraged.   Lipid Panel  Lab Results  Component Value Date   CHOL 153 08/10/2022   HDL 47 08/10/2022   LDLCALC 85 08/10/2022   TRIG 119 08/10/2022   CHOLHDL 3.3 08/10/2022     Updated lab needed at/ before next visit.

## 2023-12-15 NOTE — Assessment & Plan Note (Signed)
 Currently asymptomatic, hiowever now aware of the condition

## 2023-12-15 NOTE — Progress Notes (Signed)
 Shawna Gonzales     MRN: [id number]      DOB: 03/06/1981  Chief Complaint  Patient presents with   Medical Management of Chronic Issues    10 week follow up     HPI Shawna Gonzales is here for follow up and re-evaluation of chronic medical conditions, medication management and review of any available recent lab and radiology data.  Preventive health is updated, specifically  Cancer screening and Immunization.   Had severe adverse reaction to zepbound  with transaminitis to over 1000. Ed eval and management with subsequent Gi evaluation and management, one enz only slighthly elevated still, this was in 05/2023 Lifestyle only and attends Bariatric clinic also through [facility] Plans to have Gyne eval 2nd opinion re management options for heavy bleeding with 9 fibroids reportedly and still wanting to maintain fertility, intolerant of OCP Wants to see if there is alternate med for migraine mx , has on avg 2/ month, but immitrex gives her abn sensation in the feet when she takes it Had ROS Denies recent fever or chills. Denies sinus pressure, nasal congestion, ear pain or sore throat. Denies chest congestion, productive cough or wheezing. Denies chest pains, palpitations and leg swelling Denies abdominal pain, nausea, vomiting,diarrhea or constipation.   Denies dysuria, frequency, hesitancy or incontinence. Denies joint pain, swelling and limitation in mobility. Denies headaches, seizures, numbness, or tingling. Denies depression, anxiety or insomnia. Denies skin break down or rash.   PE  BP 136/82   Pulse 77   Resp 16   Ht 5' 5 (1.651 m)   Wt 240 lb (108.9 kg)   LMP 12/04/2023 (Exact Date)   SpO2 99%   BMI 39.94 kg/m   Patient alert and oriented and in no cardiopulmonary distress.  HEENT: No facial asymmetry, EOMI,     Neck supple .  Chest: Clear to auscultation bilaterally.  CVS: S1, S2 no murmurs, no S3.Regular rate.  ABD: Soft non tender.   Ext: No edema  MS:  Adequate ROM spine, shoulders, hips and knees.  Skin: Intact, no ulcerations or rash noted.  Psych: Good eye contact, normal affect. Memory intact not anxious or depressed appearing.  CNS: CN 2-12 intact, power,  normal throughout.no focal deficits noted.   Assessment & Plan  Morbid obesity [facility])  Patient re-educated about  the importance of commitment to a  minimum of 150 minutes of exercise per week as able.  The importance of healthy food choices with portion control discussed, as well as eating regularly and within a 12 hour window most days. The need to choose clean , green food 50 to 75% of the time is discussed, as well as to make water the primary drink and set a goal of 64 ounces water daily.       12/15/2023    9:37 AM 05/05/2023   10:13 AM 12/23/2022    1:30 PM  Weight /BMI  Weight 240 lb 239 lb 236 lb  Height 5' 5 (1.651 m) 5' 5 (1.651 m) 5' 5 (1.651 m)  BMI 39.94 kg/m2 39.77 kg/m2 39.27 kg/m2      Sleep apnea Will starrt cPAP  Migraine Currently uncontrolled, imitrex  not well tolerated , upcoming appt with Neurology  Colonic diverticulum Currently asymptomatic, hiowever now aware of the condition  Benign essential HTN Sub optimal control Treating sleep apnea and lifestyle change with weight loss. No med change Re eval in 4 months DASH diet and commitment to daily physical activity for a minimum of 30  minutes discussed and encouraged, as a part of hypertension management. The importance of attaining a healthy weight is also discussed.     12/15/2023   10:20 AM 12/15/2023    9:37 AM 05/05/2023   10:13 AM 12/23/2022    1:52 PM 12/23/2022    1:30 PM 10/05/2022    2:04 PM 08/17/2022    3:58 PM  BP/Weight  Systolic BP 136 138 132 128 137 128 150  Diastolic BP 82 79 84 74 82 77 94  Wt. (Lbs)  240 239  236 242.12   BMI  39.94 kg/m2 39.77 kg/m2  39.27 kg/m2 40.29 kg/m2        Impaired glucose tolerance Patient educated about the importance of limiting   Carbohydrate intake , the need to commit to daily physical activity for a minimum of 30 minutes , and to commit weight loss. The fact that changes in all these areas will reduce or eliminate all together the development of diabetes is stressed.      Latest Ref Rng & Units 08/10/2022    7:33 AM 05/13/2021   11:19 AM 01/08/2019    7:04 AM 10/19/2017   10:03 AM 07/11/2016   10:11 AM  Diabetic Labs  HbA1c 4.8 - 5.6 % 5.6  5.7   5.4  5.2   Chol 100 - 199 mg/dL 578  469  629  528  413   HDL >39 mg/dL 47  44  41  44  44   Calc LDL 0 - 99 mg/dL 85  244  73  71  86   Triglycerides 0 - 149 mg/dL 010  272  536  644  034   Creatinine 0.57 - 1.00 mg/dL 7.42  5.95  6.38  7.56  0.86       12/15/2023   10:20 AM 12/15/2023    9:37 AM 05/05/2023   10:13 AM 12/23/2022    1:52 PM 12/23/2022    1:30 PM 10/05/2022    2:04 PM 08/17/2022    3:58 PM  BP/Weight  Systolic BP 136 138 132 128 137 128 150  Diastolic BP 82 79 84 74 82 77 94  Wt. (Lbs)  240 239  236 242.12   BMI  39.94 kg/m2 39.77 kg/m2  39.27 kg/m2 40.29 kg/m2        No data to display          Updated lab needed at/ before next visit.   Dyslipidemia Hyperlipidemia:Low fat diet discussed and encouraged.   Lipid Panel  Lab Results  Component Value Date   CHOL 153 08/10/2022   HDL 47 08/10/2022   LDLCALC 85 08/10/2022   TRIG 119 08/10/2022   CHOLHDL 3.3 08/10/2022     Updated lab needed at/ before next visit.   Allergic rhinitis Controlled, no change in medication   GERD (gastroesophageal reflux disease) Controlled, no change in medication   Fibroids Multiple fibroids noted on imaging with iDA, upcoming 2nd Gyne opinion

## 2023-12-15 NOTE — Patient Instructions (Signed)
 F/U in 4 months, call if you need me sooner  Fasting lipid, cmp and eGFr, hBA1C, TSH , free T4 , Vit D at nearest [facility] next 1 week ( nurse please order)  Neurology and Gyne appointments as discussed  Calorie counting/ accounting goes a long way in helping with weight management , also regualr execisae. To lose 1 pound per week you nreed to walk 3 miles per day!  Treating sleep apnea is necessary and a good thing to do  Clean, green eating with beans and white meats is GREAT!  Continue to strive for the best you can be to your body , and eNJOY it !  Safe Summer!  Thanks for choosing [facility], we consider it a privelige to serve you.

## 2023-12-15 NOTE — Assessment & Plan Note (Signed)
 Patient educated about the importance of limiting  Carbohydrate intake , the need to commit to daily physical activity for a minimum of 30 minutes , and to commit weight loss. The fact that changes in all these areas will reduce or eliminate all together the development of diabetes is stressed.      Latest Ref Rng & Units 08/10/2022    7:33 AM 05/13/2021   11:19 AM 01/08/2019    7:04 AM 10/19/2017   10:03 AM 07/11/2016   10:11 AM  Diabetic Labs  HbA1c 4.8 - 5.6 % 5.6  5.7   5.4  5.2   Chol 100 - 199 mg/dL 191  478  295  621  308   HDL >39 mg/dL 47  44  41  44  44   Calc LDL 0 - 99 mg/dL 85  657  73  71  86   Triglycerides 0 - 149 mg/dL 846  962  952  841  324   Creatinine 0.57 - 1.00 mg/dL 4.01  0.27  2.53  6.64  0.86       12/15/2023   10:20 AM 12/15/2023    9:37 AM 05/05/2023   10:13 AM 12/23/2022    1:52 PM 12/23/2022    1:30 PM 10/05/2022    2:04 PM 08/17/2022    3:58 PM  BP/Weight  Systolic BP 136 138 132 128 137 128 150  Diastolic BP 82 79 84 74 82 77 94  Wt. (Lbs)  240 239  236 242.12   BMI  39.94 kg/m2 39.77 kg/m2  39.27 kg/m2 40.29 kg/m2        No data to display          Updated lab needed at/ before next visit.

## 2023-12-15 NOTE — Assessment & Plan Note (Signed)
 Multiple fibroids noted on imaging with iDA, upcoming 2nd Gyne opinion

## 2023-12-15 NOTE — Assessment & Plan Note (Signed)
 Sub optimal control Treating sleep apnea and lifestyle change with weight loss. No med change Re eval in 4 months DASH diet and commitment to daily physical activity for a minimum of 30 minutes discussed and encouraged, as a part of hypertension management. The importance of attaining a healthy weight is also discussed.     12/15/2023   10:20 AM 12/15/2023    9:37 AM 05/05/2023   10:13 AM 12/23/2022    1:52 PM 12/23/2022    1:30 PM 10/05/2022    2:04 PM 08/17/2022    3:58 PM  BP/Weight  Systolic BP 136 138 132 128 137 128 150  Diastolic BP 82 79 84 74 82 77 94  Wt. (Lbs)  240 239  236 242.12   BMI  39.94 kg/m2 39.77 kg/m2  39.27 kg/m2 40.29 kg/m2

## 2023-12-21 ENCOUNTER — Ambulatory Visit: Payer: Self-pay | Admitting: Family Medicine

## 2023-12-21 LAB — LIPID PANEL
Chol/HDL Ratio: 4 ratio (ref 0.0–4.4)
Cholesterol, Total: 179 mg/dL (ref 100–199)
HDL: 45 mg/dL (ref >39)
LDL Chol Calc (NIH): 113 mg/dL — ABNORMAL HIGH (ref 0–99)
Triglycerides: 119 mg/dL (ref 0–149)
VLDL Cholesterol Cal: 21 mg/dL (ref 5–40)

## 2023-12-21 LAB — CMP14+EGFR
ALT: 15 IU/L (ref 0–32)
AST: 14 IU/L (ref 0–40)
Albumin: 4.4 g/dL (ref 3.9–4.9)
Alkaline Phosphatase: 99 IU/L (ref 44–121)
BUN/Creatinine Ratio: 16 (ref 9–23)
BUN: 14 mg/dL (ref 6–24)
Bilirubin Total: 0.3 mg/dL (ref 0.0–1.2)
CO2: 23 mmol/L (ref 20–29)
Calcium: 9.7 mg/dL (ref 8.7–10.2)
Chloride: 102 mmol/L (ref 96–106)
Creatinine, Ser: 0.88 mg/dL (ref 0.57–1.00)
Globulin, Total: 2.4 g/dL (ref 1.5–4.5)
Glucose: 87 mg/dL (ref 70–99)
Potassium: 4.4 mmol/L (ref 3.5–5.2)
Sodium: 139 mmol/L (ref 134–144)
Total Protein: 6.8 g/dL (ref 6.0–8.5)
eGFR: 84 mL/min/{1.73_m2} (ref >59)

## 2023-12-21 LAB — HEMOGLOBIN A1C
Est. average glucose Bld gHb Est-mCnc: 105 mg/dL
Hgb A1c MFr Bld: 5.3 % (ref 4.8–5.6)

## 2023-12-21 LAB — VITAMIN D 25 HYDROXY (VIT D DEFICIENCY, FRACTURES): Vit D, 25-Hydroxy: 26.3 ng/mL — ABNORMAL LOW (ref 30.0–100.0)

## 2023-12-21 LAB — T4, FREE: Free T4: 1.17 ng/dL (ref 0.82–1.77)

## 2023-12-21 LAB — TSH: TSH: 1.73 u[IU]/mL (ref 0.450–4.500)

## 2023-12-21 MED ORDER — VITAMIN D (ERGOCALCIFEROL) 1.25 MG (50000 UNIT) PO CAPS: 50000.0000 [IU] | ORAL_CAPSULE | ORAL | 1 refills | Status: AC

## 2024-02-04 ENCOUNTER — Encounter: Payer: Self-pay | Admitting: Family Medicine

## 2024-02-16 ENCOUNTER — Other Ambulatory Visit: Payer: Self-pay | Admitting: Family Medicine

## 2024-02-28 LAB — HM MAMMOGRAPHY

## 2024-03-06 ENCOUNTER — Encounter: Payer: Self-pay | Admitting: Family Medicine

## 2024-03-07 ENCOUNTER — Encounter: Payer: Self-pay | Admitting: Family Medicine

## 2024-03-07 ENCOUNTER — Other Ambulatory Visit: Payer: Self-pay

## 2024-03-07 DIAGNOSIS — Z1159 Encounter for screening for other viral diseases: Secondary | ICD-10-CM

## 2024-04-17 ENCOUNTER — Ambulatory Visit: Admitting: Family Medicine

## 2024-05-13 ENCOUNTER — Other Ambulatory Visit: Payer: Self-pay | Admitting: Family Medicine

## 2024-07-24 ENCOUNTER — Ambulatory Visit: Admitting: Family Medicine
